# Patient Record
Sex: Male | Born: 1950 | Race: White | Hispanic: No | Marital: Married | Smoking: Never smoker
Health system: Southern US, Community
[De-identification: ages and names within clinical notes are randomized; demographics above are authoritative.]

## PROBLEM LIST (undated history)

## (undated) DIAGNOSIS — Z789 Other specified health status: Secondary | ICD-10-CM

## (undated) HISTORY — PX: VASECTOMY: SHX75

## (undated) HISTORY — PX: INGUINAL HERNIA REPAIR: SUR1180

## (undated) HISTORY — DX: Other specified health status: Z78.9

## (undated) HISTORY — PX: COLONOSCOPY: SHX174

---

## 2004-02-03 HISTORY — PX: COLONOSCOPY: SHX174

## 2004-07-22 ENCOUNTER — Ambulatory Visit: Payer: Self-pay | Admitting: Internal Medicine

## 2004-07-25 ENCOUNTER — Ambulatory Visit: Payer: Self-pay | Admitting: Internal Medicine

## 2004-09-03 ENCOUNTER — Ambulatory Visit: Payer: Self-pay | Admitting: Internal Medicine

## 2004-09-17 ENCOUNTER — Ambulatory Visit: Payer: Self-pay | Admitting: Internal Medicine

## 2004-09-17 ENCOUNTER — Encounter: Payer: Self-pay | Admitting: Family Medicine

## 2004-09-17 LAB — HM COLONOSCOPY: HM Colonoscopy: NORMAL

## 2004-10-02 ENCOUNTER — Ambulatory Visit: Payer: Self-pay | Admitting: Internal Medicine

## 2006-05-18 ENCOUNTER — Ambulatory Visit: Payer: Self-pay | Admitting: Internal Medicine

## 2010-03-04 NOTE — Procedures (Signed)
Summary: Colonoscopy Report/[facility]  Colonoscopy Report/[facility]   Imported By: Maryln Gottron 06/27/2009 10:34:49  _____________________________________________________________________  External Attachment:    Type:   Image     Comment:   External Document

## 2010-03-18 ENCOUNTER — Encounter: Payer: Self-pay | Admitting: Internal Medicine

## 2010-03-18 ENCOUNTER — Encounter (INDEPENDENT_AMBULATORY_CARE_PROVIDER_SITE_OTHER): Payer: BC Managed Care – PPO | Admitting: Internal Medicine

## 2010-03-18 DIAGNOSIS — Z0000 Encounter for general adult medical examination without abnormal findings: Secondary | ICD-10-CM

## 2010-03-18 DIAGNOSIS — N400 Enlarged prostate without lower urinary tract symptoms: Secondary | ICD-10-CM | POA: Insufficient documentation

## 2010-03-18 DIAGNOSIS — N401 Enlarged prostate with lower urinary tract symptoms: Secondary | ICD-10-CM | POA: Insufficient documentation

## 2010-03-19 ENCOUNTER — Other Ambulatory Visit: Payer: Self-pay | Admitting: Internal Medicine

## 2010-03-19 ENCOUNTER — Encounter (INDEPENDENT_AMBULATORY_CARE_PROVIDER_SITE_OTHER): Payer: Self-pay | Admitting: *Deleted

## 2010-03-19 ENCOUNTER — Other Ambulatory Visit (INDEPENDENT_AMBULATORY_CARE_PROVIDER_SITE_OTHER): Payer: BC Managed Care – PPO

## 2010-03-19 DIAGNOSIS — Z0000 Encounter for general adult medical examination without abnormal findings: Secondary | ICD-10-CM

## 2010-03-19 DIAGNOSIS — N400 Enlarged prostate without lower urinary tract symptoms: Secondary | ICD-10-CM

## 2010-03-19 LAB — BASIC METABOLIC PANEL
CO2: 30 mEq/L (ref 19–32)
Calcium: 9.4 mg/dL (ref 8.4–10.5)
Creatinine, Ser: 1.1 mg/dL (ref 0.4–1.5)

## 2010-03-19 LAB — LIPID PANEL
HDL: 45 mg/dL (ref >39.00)
Total CHOL/HDL Ratio: 3
Triglycerides: 36 mg/dL (ref 0.0–149.0)

## 2010-03-19 LAB — HEPATIC FUNCTION PANEL
Bilirubin, Direct: 0.2 mg/dL (ref 0.0–0.3)
Total Bilirubin: 1.6 mg/dL — ABNORMAL HIGH (ref 0.3–1.2)
Total Protein: 6.5 g/dL (ref 6.0–8.3)

## 2010-03-19 LAB — TSH: TSH: 1.49 u[IU]/mL (ref 0.35–5.50)

## 2010-03-20 LAB — CBC WITH DIFFERENTIAL/PLATELET
Basophils Absolute: 0 10*3/uL (ref 0.0–0.1)
Eosinophils Absolute: 0.1 10*3/uL (ref 0.0–0.7)
HCT: 43 % (ref 39.0–52.0)
Lymphs Abs: 1.5 10*3/uL (ref 0.7–4.0)
MCHC: 35.5 g/dL (ref 30.0–36.0)
Monocytes Absolute: 0.4 10*3/uL (ref 0.1–1.0)
Monocytes Relative: 8.6 % (ref 3.0–12.0)
Platelets: 170 10*3/uL (ref 150.0–400.0)
RDW: 14.2 % (ref 11.5–14.6)

## 2010-03-26 NOTE — Assessment & Plan Note (Signed)
Summary: cpx/kn   Vital Signs:  Patient profile:   60 year old male Height:      74 inches Weight:      217.6 pounds BMI:     28.04 Temp:     98.5 degrees F oral Pulse rate:   80 / minute Resp:     14 per minute BP sitting:   124 / 86  (left arm) Cuff size:   large  Vitals Entered By: Shonna Chock CMA (March 18, 2010 2:16 PM)  CC: Re-Establish: CPX , General Medical Evaluation Comments TD-UTD With in the last five years (? 2007)   CC:  Re-Establish: CPX  and General Medical Evaluation.  History of Present Illness:    James [name] is here for a physical; he is asymptomatic.  Preventive Screening-Counseling & Management  Alcohol-Tobacco     Smoking Status: never  Caffeine-Diet-Exercise     Does Patient Exercise: yes  Current Medications (verified): 1)  None  Allergies (verified): No Known Drug Allergies  Past History:  Past Medical History: Blood Donor ; Gilbert's Syndrome ; HDL 34.5 2006  Past Surgical History: Colonoscopy 2006 negative , Dr Leone Payor Vasectomy  Family History: Father: DECEASED, ? Heart Atttack @ 36 Mother: LIVING, CVA @ 75 Siblings: 3 BROTHERS, LIVING   Social History: Retired Married Never Smoked Alcohol use-yes: 3 beers / week Regular exercise-yes:Gym ; cardio & machines 90-120 min 3X/week Smoking Status:  never Does Patient Exercise:  yes  Review of Systems  The patient denies anorexia, fever, weight loss, weight gain, vision loss, decreased hearing, hoarseness, chest pain, syncope, dyspnea on exertion, peripheral edema, prolonged cough, headaches, hemoptysis, abdominal pain, melena, hematochezia, severe indigestion/heartburn, hematuria, suspicious skin lesions, depression, unusual weight change, abnormal bleeding, enlarged lymph nodes, and angioedema.    Physical Exam  General:  Well-developed,well-nourished; alert,appropriate and cooperative throughout examination Head:  Normocephalic and atraumatic without obvious  abnormalities. No apparent alopecia; moustache Eyes:  No corneal or conjunctival inflammation noted. Perrla. Funduscopic exam benign, without hemorrhages, exudates or papilledema.  Ears:  External ear exam shows no significant lesions or deformities.  Otoscopic examination reveals clear canals, tympanic membranes are intact bilaterally without bulging, retraction, inflammation or discharge. Hearing is grossly normal bilaterally. Nose:  External nasal examination shows benign papule w/o  deformity or inflammation. Nasal mucosa are pink and moist without lesions or exudates.  Mouth:  Oral mucosa and oropharynx without lesions or exudates.  Teeth in good repair. Neck:  No deformities, masses, or tenderness noted. Lungs:  Normal respiratory effort, chest expands symmetrically. Lungs are clear to auscultation, no crackles or wheezes. Heart:  Normal rate and regular rhythm. S1 and S2 normal without gallop, murmur, click, rub or other extra sounds. Abdomen:  Bowel sounds positive,abdomen soft and non-tender without masses, organomegaly or hernias noted. Rectal:  No external abnormalities noted. Normal sphincter tone. No rectal masses or tenderness. Genitalia:  Testes bilaterally descended without nodularity, tenderness or masses. No scrotal masses or lesions. No penis lesions or urethral discharge. L varicocele.  Minor Vasectomy  scar tissue Prostate:  no nodules, no asymmetry, no induration,  1+ enlarged.   Msk:  No deformity or scoliosis noted of thoracic or lumbar spine.   Pulses:  R and L carotid,radial,dorsalis pedis and posterior tibial pulses are full and equal bilaterally Extremities:  No clubbing, cyanosis, edema, or deformity noted with normal full range of motion of all joints.   Neurologic:  alert & oriented X3 and DTRs symmetrical and normal.   Skin:  Intact without suspicious lesions or rashes Cervical Nodes:  No lymphadenopathy noted Axillary Nodes:  No palpable lymphadenopathy Inguinal  Nodes:  No significant adenopathy Psych:  memory intact for recent and remote, normally interactive, and good eye contact.     Impression & Recommendations:  Problem # 1:  ROUTINE GENERAL MEDICAL EXAM@HEALTH  CARE FACL (ICD-V70.0)  Orders: EKG w/ Interpretation (93000)  Problem # 2:  HYPERPLASIA PROSTATE UNS W/O UR OBST & OTH LUTS (ICD-600.90)  Patient Instructions: 1)  Complete stool cards;schedule fasting  labs; see Diagnoses for Codes: 2)  BMP ; 3)  Hepatic Panel ; 4)  Lipid Panel ; 5)  TSH ; 6)  CBC w/ Diff ; 7)  PSA.   Orders Added: 1)  Est. Patient 40-64 years [99396] 2)  EKG w/ Interpretation [93000]

## 2010-04-11 ENCOUNTER — Other Ambulatory Visit (INDEPENDENT_AMBULATORY_CARE_PROVIDER_SITE_OTHER): Payer: BC Managed Care – PPO

## 2010-04-11 ENCOUNTER — Encounter: Payer: Self-pay | Admitting: Internal Medicine

## 2010-04-11 DIAGNOSIS — Z1211 Encounter for screening for malignant neoplasm of colon: Secondary | ICD-10-CM

## 2010-04-11 LAB — CONVERTED CEMR LAB: OCCULT 3: NEGATIVE

## 2010-04-14 ENCOUNTER — Encounter (INDEPENDENT_AMBULATORY_CARE_PROVIDER_SITE_OTHER): Payer: Self-pay | Admitting: *Deleted

## 2010-04-22 NOTE — Letter (Signed)
Summary: Results Follow up Letter  James [name]  [address] [place], [place] [address]   Phone: [phone]  Fax: [phone]    04/14/2010 MRN: [id number]  James [name] [address] [place], [place]  [address]  [place]  Dear James [name],  The following are the results of your recent test(s):  Test         Result    Pap Smear:        Normal _____  Not Normal _____ Comments: ______________________________________________________ Cholesterol: LDL(Bad cholesterol):         Your goal is less than:         HDL (Good cholesterol):       Your goal is more than: Comments:  ______________________________________________________ Mammogram:        Normal _____  Not Normal _____ Comments:  ___________________________________________________________________ Hemoccult:        Normal _X____  Not normal _______ Comments:    _____________________________________________________________________ Other Tests:    We routinely do not discuss normal results over the telephone.  If you desire a copy of the results, or you have any questions about this information we can discuss them at your next office visit.   Sincerely,

## 2010-05-21 ENCOUNTER — Telehealth: Payer: Self-pay | Admitting: *Deleted

## 2010-05-21 NOTE — Telephone Encounter (Signed)
Left msg on voicemail.

## 2010-05-23 NOTE — Telephone Encounter (Signed)
Spoke w/ pt wife appt scheduled to see Hop.

## 2010-05-27 ENCOUNTER — Encounter: Payer: Self-pay | Admitting: Internal Medicine

## 2010-05-27 ENCOUNTER — Ambulatory Visit (INDEPENDENT_AMBULATORY_CARE_PROVIDER_SITE_OTHER): Payer: BC Managed Care – PPO | Admitting: Internal Medicine

## 2010-05-27 VITALS — BP 154/90 | HR 72 | Temp 98.3°F | Wt 209.4 lb

## 2010-05-27 DIAGNOSIS — K621 Rectal polyp: Secondary | ICD-10-CM

## 2010-05-27 DIAGNOSIS — R030 Elevated blood-pressure reading, without diagnosis of hypertension: Secondary | ICD-10-CM

## 2010-05-27 DIAGNOSIS — K620 Anal polyp: Secondary | ICD-10-CM

## 2010-05-27 MED ORDER — HYDROCORTISONE ACE-PRAMOXINE 1-1 % RE FOAM: 1.0000 | Freq: Two times a day (BID) | RECTAL | Status: AC

## 2010-05-27 NOTE — Progress Notes (Signed)
  Subjective:    Patient ID: James [name], male    DOB: [birth year], 60 y.o.   MRN: [id number]  HPI RECTAL DISCOMFORT/ PAIN (Note: NO bleeding!)  Onset: 2-3 weeks ago  Radiation: no  Severity: up to 3 Quality: burning Duration: present for 2-3 weeks but improved past 3-4 days No better with Prep H   Associated Symptoms: Nausea/Vomiting: no  Diarrhea: no  Constipation: no  Melena/BRBPR: no  Anorexia: no  Fever/Chills: no  Rash: no, but a "white growth protruding out side 2 weeks ago, resolved as of 3 days" . No rectal itching ,especially nocturnally.  Wt loss: no   Past Surgeries: internal rectal abscess 1997; colonoscopy ; negative @age  52                                        No FH GI disease 15 month grandchild ; outside dog      Review of Systems see BP ; BP 134/88 @ home      Objective:   Physical Exam General appearance is one of good health and nourishment. Bowel sounds are normal. Abdomen is soft and nontender with no organomegaly, hernias  or masses.  Rectal tone is normal.External fleshly, non tender  appendage , 1 X 2 cm. The prostate is normal in size; there is no induration or nodule present. Lymphatic: No lymphadenopathy is noted about the head, neck, or axilla  areas.         Assessment & Plan:  #1 pararectal mass, rectal polyp suggested clinically rather than hemorrhoidal tissue.  #2 hypertension in the context of anxiety about present condition. By history blood pressure is well controlled at home.  Plan: ProctoFoam-HC will be applied to 3 times a day after Sitz bath.  #2 Gen. surgery referral will be made  #3 close monitoring of blood pressure will be conducted at home; goal= average less than 135/85.

## 2010-05-27 NOTE — Patient Instructions (Addendum)
Apply the ProctoFoam  HC  twice a day after using sitz baths.Rectal hygiene as discussed.                         Your BP goal = average < 135/85

## 2012-03-24 ENCOUNTER — Telehealth: Payer: Self-pay | Admitting: Internal Medicine

## 2012-03-24 NOTE — Telephone Encounter (Signed)
Patient Information:  Caller Name: Stanton Kidney  Phone: [phone]  Patient: James [name], James [name]  Gender: Male  DOB: 03-13-1950  Age: 62 Years  PCP: Marga Melnick  Office Follow Up:  Does the office need to follow up with this patient?: No  Instructions For The Office: N/A  RN Note:  Spasms of dry cough, worse during day.  Post nasal drainage present. Will try hydration and humidification. May use Mucinex as directed or Robitussin DM sparingly for hard coughing. Offered and declined appointment.  Symptoms  Reason For Call & Symptoms: Dry, deep cough with sinus drainage.  Only coughs when sitting up  Reviewed Health History In EMR: Yes  Reviewed Medications In EMR: Yes  Reviewed Allergies In EMR: Yes  Reviewed Surgeries / Procedures: Yes  Date of Onset of Symptoms: 03/21/2012  Treatments Tried: warm compress to forehead,  Robitussin Cold/cough  Treatments Tried Worked: No  Guideline(s) Used:  Cough  Disposition Per Guideline:   See Within 3 Days in Office  Reason For Disposition Reached:   Allergy symptoms are also present (e.g., itchy eyes, clear nasal discharge, postnasal drip)  Advice Given:  Reassurance  Coughing is the way that our lungs remove irritants and mucus. It helps protect our lungs from getting pneumonia.  You can get a dry hacking cough after a chest cold. Sometimes this type of cough can last 1-3 weeks, and be worse at night.  You can also get a cough after being exposed to irritating substances like smoke, strong perfumes, and dust.  Cough Medicines:  OTC Cough Syrups: The most common cough suppressant in OTC cough medications is dextromethorphan. Often the letters "DM" appear in the name.  OTC Cough Drops: Cough drops can help a lot, especially for mild coughs. They reduce coughing by soothing your irritated throat and removing that tickle sensation in the back of the throat. Cough drops also have the advantage of portability - you can carry them with you.  Home  Remedy - Hard Candy: Hard candy works just as well as medicine-flavored OTC cough drops. Diabetics should use sugar-free candy.  Home Remedy - Honey: This old home remedy has been shown to help decrease coughing at night. The adult dosage is 2 teaspoons (10 ml) at bedtime. Honey should not be given to infants under one year of age.  Caution - Dextromethorphan:   Do not try to completely suppress coughs that produce mucus and phlegm. Remember that coughing is helpful in bringing up mucus from the lungs and preventing pneumonia.  Coughing Spasms:  Drink warm fluids. Inhale warm mist (Reason: both relax the airway and loosen up the phlegm).  Prevent Dehydration:  Drink adequate liquids.  This will help soothe an irritated or dry throat and loosen up the phlegm.  Expected Course:   The expected course depends on what is causing the cough.  Viral bronchitis (chest cold) causes a cough that lasts 1 to 3 weeks. Sometimes you may cough up lots of phlegm (sputum, mucus). The mucus can normally be white, gray, yellow, or green.  Call Back If:  Difficulty breathing  Cough lasts more than 3 weeks  Fever lasts > 3 days  You become worse.  Reassurance  Colds are very common and may make you feel uncomfortable.  Colds are caused by viruses, and no medicine or "shot" will cure an uncomplicated cold.  Colds are usually not serious.  RN Overrode Recommendation:  Follow Up With Office Later  declined appointment now

## 2012-07-07 ENCOUNTER — Telehealth: Payer: Self-pay | Admitting: Internal Medicine

## 2012-07-07 ENCOUNTER — Ambulatory Visit (INDEPENDENT_AMBULATORY_CARE_PROVIDER_SITE_OTHER): Payer: BC Managed Care – PPO | Admitting: Internal Medicine

## 2012-07-07 ENCOUNTER — Encounter: Payer: Self-pay | Admitting: Internal Medicine

## 2012-07-07 VITALS — BP 128/80 | HR 85 | Temp 98.6°F | Wt 208.0 lb

## 2012-07-07 DIAGNOSIS — J209 Acute bronchitis, unspecified: Secondary | ICD-10-CM

## 2012-07-07 MED ORDER — AZITHROMYCIN 250 MG PO TABS: ORAL_TABLET | ORAL | Status: DC

## 2012-07-07 MED ORDER — HYDROCODONE-HOMATROPINE 5-1.5 MG/5ML PO SYRP: 5.0000 mL | ORAL_SOLUTION | Freq: Four times a day (QID) | ORAL | Status: DC | PRN

## 2012-07-07 NOTE — Telephone Encounter (Signed)
Patient Information:  Caller Name: Brett Canales  Phone: [phone]  Patient: James [name], James [name]  Gender: Male  DOB: 21-Jul-1950  Age: 62 Years  PCP: Marga Melnick  Office Follow Up:  Does the office need to follow up with this patient?: No  Instructions For The Office: N/A  RN Note:  Pt   Symptoms  Reason For Call & Symptoms: Cough for 3 wks.  Afebrile.  Mostly a dry cough. Intermittent coughing during the day.   In the evening pt develops hoarseness and when he goes to bed he will have an approx  30 minute coughing spell.  No other symptoms present.     Reviewed Health History In EMR: Yes  Reviewed Medications In EMR: Yes, ASA 81 mg, Fish Oil  Reviewed Allergies In EMR: Yes  Reviewed Surgeries / Procedures: Yes  Date of Onset of Symptoms: 06/13/2012  Treatments Tried: Robutussin CF and DM Honey Throat Spray Cough Drops  Treatments Tried Worked: No  Guideline(s) Used:  Cough  Disposition Per Guideline:   See Within 3 Days in Office  Reason For Disposition Reached:   Cough has been present for > 10 days  Advice Given:  Call Back If:  Difficulty breathing  You become worse.  Patient Will Follow Care Advice:  YES

## 2012-07-07 NOTE — Progress Notes (Signed)
  Subjective:    Patient ID: James [name], male    DOB: Feb 14, 1950, 62 y.o.   MRN: [id number]  HPI   Symptoms began 3.5 weeks ago as hoarseness while traveling in a  camper trailer after arriving in [place]. He subsequently developed a cough which has been essentially nonproductive 80% of the time. Occasionally will have some yellow-green sputum, mainly in the morning.  The cough has not responded to Robitussin-DM or Robitussin CF. He has noted that air-conditioning or cold fluids induce the cough  He has never smoked and has no history of asthma. He does have some history of seasonal allergies    Review of Systems His symptoms did not begin with extrinsic signs or symptoms such as itchy, watery eyes or sneezing. The cough has not been associated with wheezing or shortness of breath. He also denies fever, chills, or sweats.  He denies frontal headache, facial pain, significant nasal purulence, significant sore throat, dental pain, otic pain, or otic discharge    Objective:   Physical Exam  He appears healthy and well-nourished  He has no lymphadenopathy about the neck or axilla  ENT exam is unremarkable. Specifically there is no nasal purulence or oropharyngeal exudates.  Chest is clear to auscultation. A detailed manual and auscultory exam of the thorax was normal  He has an S4 with no significant murmurs or gallops.  There is no edema, cyanosis, or clubbing.  He's a well tanned without significant skin lesions.        Assessment & Plan:  #1 acute bronchitis without bronchospasm or associated rhinosinusitis  Plan: See orders and recommendations

## 2012-07-07 NOTE — Patient Instructions (Addendum)
Stay well hydrated. Drink to thirst up to 40 ounces of fluids daily. Advair one inhalation every 12 hours; gargle and spit after use

## 2012-07-07 NOTE — Telephone Encounter (Signed)
Pt scheduled today @ 2pm w/ Dr. Alwyn Ren.

## 2013-02-10 ENCOUNTER — Encounter: Payer: Self-pay | Admitting: Internal Medicine

## 2013-02-10 ENCOUNTER — Ambulatory Visit (INDEPENDENT_AMBULATORY_CARE_PROVIDER_SITE_OTHER): Payer: BC Managed Care – PPO | Admitting: Internal Medicine

## 2013-02-10 VITALS — BP 130/76 | HR 82 | Temp 99.1°F | Wt 213.6 lb

## 2013-02-10 DIAGNOSIS — J209 Acute bronchitis, unspecified: Secondary | ICD-10-CM

## 2013-02-10 DIAGNOSIS — J310 Chronic rhinitis: Secondary | ICD-10-CM

## 2013-02-10 MED ORDER — AZITHROMYCIN 250 MG PO TABS: ORAL_TABLET | ORAL | Status: DC

## 2013-02-10 MED ORDER — HYDROCODONE-HOMATROPINE 5-1.5 MG/5ML PO SYRP: 5.0000 mL | ORAL_SOLUTION | Freq: Four times a day (QID) | ORAL | Status: DC | PRN

## 2013-02-10 NOTE — Progress Notes (Signed)
Pre visit review using our clinic review tool, if applicable. No additional management support is needed unless otherwise documented below in the visit note. 

## 2013-02-10 NOTE — Progress Notes (Signed)
   Subjective:    Patient ID: James [name], male    DOB: January 18, 1951, 63 y.o.   MRN: [id number]  HPI   Symptoms began one week ago as a nonproductive cough. As of 1/8 he's developed head congestion. He's had some chills without a definite fever or sweats   He has had pain in the frontal and maxillary associated with postnasal drainage but not with nasal purulence.  He did not take the flu shot this year.  Review of Systems He has not had any significant myalgias or arthralgias. No otic pain or discharge  He specifically denies any extrinsic symptoms of itchy, watery eyes, sneezing.  The cough is not associated with shortness of breath or wheezing.       Objective:   Physical Exam General appearance:good health ;well nourished; no acute distress or increased work of breathing is present.  No  lymphadenopathy about the head, neck, or axilla noted.   Eyes: No conjunctival inflammation or lid edema is present. There is no scleral icterus.  Ears:  External ear exam shows no significant lesions or deformities.  Otoscopic examination reveals clear canals, tympanic membranes are intact bilaterally without bulging, retraction, inflammation or discharge.  Nose:  External nasal examination shows no deformity or inflammation. Nasal mucosa are pink and moist without lesions or exudates. No septal dislocation or deviation.No obstruction to airflow.   Oral exam: Dental hygiene is good; lips and gums are healthy appearing.There is no oropharyngeal erythema or exudate noted.   Neck:  No deformities, thyromegaly, masses, or tenderness noted.   Supple with full range of motion without pain.   Heart:  Normal rate and regular rhythm. S1 and S2 normal without gallop, murmur, click, rub or other extra sounds.   Lungs:Chest clear to auscultation; no wheezes, rhonchi,rales ,or rubs present.No increased work of breathing.    Extremities:  No cyanosis, edema, or clubbing  noted    Skin: Warm & dry w/o  jaundice or tenting.         Assessment & Plan:  #1 acute bronchitis See orders

## 2013-02-10 NOTE — Patient Instructions (Signed)

## 2014-01-08 ENCOUNTER — Encounter: Payer: BC Managed Care – PPO | Admitting: Internal Medicine

## 2014-01-12 ENCOUNTER — Other Ambulatory Visit (INDEPENDENT_AMBULATORY_CARE_PROVIDER_SITE_OTHER): Payer: BC Managed Care – PPO

## 2014-01-12 ENCOUNTER — Encounter: Payer: Self-pay | Admitting: Internal Medicine

## 2014-01-12 ENCOUNTER — Ambulatory Visit (INDEPENDENT_AMBULATORY_CARE_PROVIDER_SITE_OTHER): Payer: BC Managed Care – PPO

## 2014-01-12 ENCOUNTER — Ambulatory Visit (INDEPENDENT_AMBULATORY_CARE_PROVIDER_SITE_OTHER): Payer: BC Managed Care – PPO | Admitting: Internal Medicine

## 2014-01-12 VITALS — BP 130/84 | HR 67 | Temp 98.0°F | Resp 13 | Ht 74.0 in | Wt 211.0 lb

## 2014-01-12 DIAGNOSIS — Z23 Encounter for immunization: Secondary | ICD-10-CM

## 2014-01-12 DIAGNOSIS — N400 Enlarged prostate without lower urinary tract symptoms: Secondary | ICD-10-CM

## 2014-01-12 DIAGNOSIS — Z0189 Encounter for other specified special examinations: Secondary | ICD-10-CM

## 2014-01-12 DIAGNOSIS — Z0000 Encounter for general adult medical examination without abnormal findings: Secondary | ICD-10-CM

## 2014-01-12 DIAGNOSIS — J3089 Other allergic rhinitis: Secondary | ICD-10-CM

## 2014-01-12 DIAGNOSIS — L719 Rosacea, unspecified: Secondary | ICD-10-CM

## 2014-01-12 DIAGNOSIS — J309 Allergic rhinitis, unspecified: Secondary | ICD-10-CM

## 2014-01-12 DIAGNOSIS — Z8249 Family history of ischemic heart disease and other diseases of the circulatory system: Secondary | ICD-10-CM

## 2014-01-12 LAB — CBC WITH DIFFERENTIAL/PLATELET
Basophils Absolute: 0 10*3/uL (ref 0.0–0.1)
Basophils Relative: 0.6 % (ref 0.0–3.0)
EOS PCT: 3.9 % (ref 0.0–5.0)
Eosinophils Absolute: 0.2 10*3/uL (ref 0.0–0.7)
HEMATOCRIT: 45.6 % (ref 39.0–52.0)
HEMOGLOBIN: 15.8 g/dL (ref 13.0–17.0)
LYMPHS ABS: 1.4 10*3/uL (ref 0.7–4.0)
Lymphocytes Relative: 26.9 % (ref 12.0–46.0)
MCHC: 34.7 g/dL (ref 30.0–36.0)
MCV: 87.1 fl (ref 78.0–100.0)
MONO ABS: 0.4 10*3/uL (ref 0.1–1.0)
Monocytes Relative: 7.5 % (ref 3.0–12.0)
NEUTROS ABS: 3.2 10*3/uL (ref 1.4–7.7)
Neutrophils Relative %: 61.1 % (ref 43.0–77.0)
Platelets: 192 10*3/uL (ref 150.0–400.0)
RBC: 5.24 Mil/uL (ref 4.22–5.81)
RDW: 13.3 % (ref 11.5–15.5)
WBC: 5.2 10*3/uL (ref 4.0–10.5)

## 2014-01-12 LAB — LIPID PANEL
Cholesterol: 145 mg/dL (ref 0–200)
HDL: 50.6 mg/dL (ref >39.00)
LDL Cholesterol: 87 mg/dL (ref 0–99)
NONHDL: 94.4
Total CHOL/HDL Ratio: 3
Triglycerides: 37 mg/dL (ref 0.0–149.0)
VLDL: 7.4 mg/dL (ref 0.0–40.0)

## 2014-01-12 LAB — HEPATIC FUNCTION PANEL
ALBUMIN: 4.4 g/dL (ref 3.5–5.2)
ALT: 21 U/L (ref 0–53)
AST: 24 U/L (ref 0–37)
Alkaline Phosphatase: 50 U/L (ref 39–117)
Bilirubin, Direct: 0.3 mg/dL (ref 0.0–0.3)
Total Bilirubin: 1.7 mg/dL — ABNORMAL HIGH (ref 0.2–1.2)
Total Protein: 7.1 g/dL (ref 6.0–8.3)

## 2014-01-12 LAB — BASIC METABOLIC PANEL
BUN: 15 mg/dL (ref 6–23)
CHLORIDE: 105 meq/L (ref 96–112)
CO2: 26 mEq/L (ref 19–32)
Calcium: 9.5 mg/dL (ref 8.4–10.5)
Creatinine, Ser: 1 mg/dL (ref 0.4–1.5)
GFR: 83.63 mL/min (ref >60.00)
Glucose, Bld: 93 mg/dL (ref 70–99)
POTASSIUM: 4.5 meq/L (ref 3.5–5.1)
SODIUM: 136 meq/L (ref 135–145)

## 2014-01-12 LAB — TSH: TSH: 1.52 u[IU]/mL (ref 0.35–4.50)

## 2014-01-12 MED ORDER — METRONIDAZOLE 0.75 % EX GEL: 1.0000 "application " | Freq: Two times a day (BID) | CUTANEOUS | Status: DC

## 2014-01-12 NOTE — Progress Notes (Signed)
   Subjective:    Patient ID: James [name], male    DOB: 10/23/1950, 63 y.o.   MRN: [id number]  HPI  He is here for a physical;acute issues denied.  He is on a modified heart healthy diet. He exercises 90 minutes 3 times a week including 30 minutes of cardiovascular exercise. He denies any associated cardiopulmonary symptoms  His father had a heart attack at 63. His father did smoke cigars.  His lipid panel has been excellent in the past.     Review of Systems   Chest pain, palpitations, tachycardia, exertional dyspnea, paroxysmal nocturnal dyspnea, claudication or edema are absent.  He has intermittent nonproductive cough which appears to be related to postnasal drainage. He also has associated sneezing. He denies exercise-induced bronchospasm symptoms.       Objective:   Physical Exam  Gen.: Healthy and well-nourished in appearance. Alert, appropriate and cooperative throughout exam. Appears younger than stated age  Head: Normocephalic without obvious abnormalities;  Moustache; no alopecia  Eyes: No corneal or conjunctival inflammation noted. Pupils equal round reactive to light and accommodation. Extraocular motion intact.  Ears: External  ear exam reveals no significant lesions or deformities. Canals clear .TMs normal. Hearing is grossly normal bilaterally. Nose: External nasal exam reveals no deformity or inflammation. Nasal mucosa are pink and moist. No lesions or exudates noted.   Mouth: Oral mucosa and oropharynx reveal no lesions or exudates. Teeth in good repair. Neck: No deformities, masses, or tenderness noted. Range of motion & Thyroid normal. Lungs: Normal respiratory effort; chest expands symmetrically. Lungs are clear to auscultation without rales, wheezes, or increased work of breathing. Heart: Normal rate and rhythm. Normal S1 and S2. No gallop, click, or rub. No murmur. Abdomen: Bowel sounds normal; abdomen soft and nontender. No masses, organomegaly or  hernias noted. Genitalia:  normal. Prostate is normal without enlargement, asymmetry, nodularity, or induration                                    Musculoskeletal/extremities: No deformity or scoliosis noted of  the thoracic or lumbar spine No clubbing, cyanosis, edema, or significant extremity  deformity noted.  Range of motion normal . Tone & strength normal. Hand joints normal. Fingernail health good. Fungal toenail changes Able to lie down & sit up w/o help.  Negative SLR bilaterally Vascular: Carotid, radial artery, dorsalis pedis and  posterior tibial pulses are full and equal. No bruits present. Neurologic: Alert and oriented x3. Deep tendon reflexes symmetrical and normal.  Gait normal      Skin: Intact without suspicious lesions or rashes.Benign papule tip of nose Lymph: No cervical, axillary, or inguinal lymphadenopathy present. Psych: Mood and affect are normal. Normally interactive                                                                                     Assessment & Plan:  #1 comprehensive physical exam; no acute findings  Plan: see Orders  & Recommendations

## 2014-01-12 NOTE — Progress Notes (Signed)
Pre visit review using our clinic review tool, if applicable. No additional management support is needed unless otherwise documented below in the visit note. 

## 2014-01-12 NOTE — Patient Instructions (Addendum)
Your next office appointment will be determined based upon review of your pending labs . Those instructions will be transmitted to you through My Chart   After showering  use a hairdryer to blow the nails dry. Also do this after any activities which cause sweating. Change socks repeatedly through the day if the feet do sweat. Tennis shoes or other foot wear which appear somewhat moldy should be discarded. Plain Mucinex (NOT D) for thick secretions ;force NON dairy fluids .   Nasal cleansing in the shower as discussed with lather of mild shampoo.After 10 seconds wash off lather while  exhaling through nostrils. Make sure that all residual soap is removed to prevent irritation.  Flonase OR Nasacort AQ 1 spray in each nostril twice a day as needed. Use the "crossover" technique into opposite nostril spraying toward opposite ear @ 45 degree angle, not straight up into nostril.  Use a Neti pot daily only  as needed for significant sinus congestion; going from open side to congested side . Plain Allegra (NOT D )  160 daily , Loratidine 10 mg , OR Zyrtec 10 mg @ bedtime  as needed for itchy eyes & sneezing. As per the Standard of Care , screening Colonoscopy recommended @ 50 & every 10 years thereafter . YOU ARE DUE IN 2016 .

## 2014-05-04 ENCOUNTER — Encounter: Payer: Self-pay | Admitting: Internal Medicine

## 2014-11-05 ENCOUNTER — Encounter: Payer: Self-pay | Admitting: Internal Medicine

## 2015-03-25 ENCOUNTER — Encounter: Payer: Self-pay | Admitting: Internal Medicine

## 2015-05-13 ENCOUNTER — Telehealth: Payer: Self-pay | Admitting: *Deleted

## 2015-05-13 NOTE — Telephone Encounter (Signed)
Fine with me

## 2015-05-13 NOTE — Telephone Encounter (Signed)
This patient  had a colon with Dr Carlean Purl 09-17-2004 that was normal  and has a colon scheduled for 05-29-15  with Dr Ardis Hughs.  I called this am to Schedule with Dr Carlean Purl and the wife states she wishes the pt. to see Dr Ardis Hughs. She gave no reason as to this change. Is this change ok with both of you?  Please advise  Thanks, Marijean Niemann

## 2015-05-13 NOTE — Telephone Encounter (Signed)
Dr Jacobs Please advise 

## 2015-05-14 NOTE — Telephone Encounter (Signed)
Ok with me as well  

## 2015-05-14 NOTE — Telephone Encounter (Signed)
Will proceed as scheduled. Thanks to all [facility]

## 2015-05-16 ENCOUNTER — Ambulatory Visit (AMBULATORY_SURGERY_CENTER): Payer: Self-pay | Admitting: *Deleted

## 2015-05-16 VITALS — Ht 74.0 in | Wt 205.0 lb

## 2015-05-16 DIAGNOSIS — Z1211 Encounter for screening for malignant neoplasm of colon: Secondary | ICD-10-CM

## 2015-05-16 MED ORDER — NA SULFATE-K SULFATE-MG SULF 17.5-3.13-1.6 GM/177ML PO SOLN: 1.0000 | Freq: Once | ORAL | Status: DC

## 2015-05-16 NOTE — Progress Notes (Signed)
No egg or soy allergy known to patient  No issues with past sedation with any surgeries  or procedures, no intubation problems  No diet pills per patient No home 02 use per patient  No blood thinners per patient  Pt denies issues with constipation  emmi declined

## 2015-05-29 ENCOUNTER — Encounter: Payer: Self-pay | Admitting: Gastroenterology

## 2015-05-29 ENCOUNTER — Ambulatory Visit (AMBULATORY_SURGERY_CENTER): Payer: BC Managed Care – PPO | Admitting: Gastroenterology

## 2015-05-29 VITALS — BP 113/79 | HR 68 | Temp 98.6°F | Resp 13 | Ht 74.0 in | Wt 205.0 lb

## 2015-05-29 DIAGNOSIS — Z1211 Encounter for screening for malignant neoplasm of colon: Secondary | ICD-10-CM | POA: Diagnosis not present

## 2015-05-29 DIAGNOSIS — K635 Polyp of colon: Secondary | ICD-10-CM

## 2015-05-29 DIAGNOSIS — D124 Benign neoplasm of descending colon: Secondary | ICD-10-CM

## 2015-05-29 DIAGNOSIS — D125 Benign neoplasm of sigmoid colon: Secondary | ICD-10-CM | POA: Diagnosis not present

## 2015-05-29 DIAGNOSIS — D126 Benign neoplasm of colon, unspecified: Secondary | ICD-10-CM | POA: Diagnosis not present

## 2015-05-29 MED ORDER — SODIUM CHLORIDE 0.9 % IV SOLN: 500.0000 mL | INTRAVENOUS | Status: DC

## 2015-05-29 NOTE — Progress Notes (Signed)
Called to room to assist during endoscopic procedure.  Patient ID and intended procedure confirmed with present staff. Received instructions for my participation in the procedure from the performing physician.  

## 2015-05-29 NOTE — Progress Notes (Signed)
To pacu vss patent aw report to rn 

## 2015-05-29 NOTE — Op Note (Signed)
[place] Patient Name: James [name] Procedure Date: 05/29/2015 9:18 AM MRN: [id number] Endoscopist: Milus Banister , MD Age: 65 Date of Birth: [birth year] Gender: Male Procedure:                Colonoscopy Indications:              Screening for colorectal malignant neoplasm Medicines:                Monitored Anesthesia Care Procedure:                Pre-Anesthesia Assessment:                           - Prior to the procedure, a History and Physical                            was performed, and patient medications and                            allergies were reviewed. The patient's tolerance of                            previous anesthesia was also reviewed. The risks                            and benefits of the procedure and the sedation                            options and risks were discussed with the patient.                            All questions were answered, and informed consent                            was obtained. Prior Anticoagulants: The patient has                            taken no previous anticoagulant or antiplatelet                            agents. ASA Grade Assessment: II - A patient with                            mild systemic disease. After reviewing the risks                            and benefits, the patient was deemed in                            satisfactory condition to undergo the procedure.                           After obtaining informed consent, the colonoscope  was passed under direct vision. Throughout the                            procedure, the patient's blood pressure, pulse, and                            oxygen saturations were monitored continuously. The                            Model CF-HQ190L [id number]) scope was introduced                            through the anus and advanced to the the cecum,                            identified by appendiceal orifice and ileocecal                     valve. The colonoscopy was performed without                            difficulty. The patient tolerated the procedure                            well. The quality of the bowel preparation was                            excellent. The ileocecal valve, appendiceal                            orifice, and rectum were photographed. Scope In: 9:25:22 AM Scope Out: 9:44:53 AM Scope Withdrawal Time: 0 hours 16 minutes 3 seconds  Total Procedure Duration: 0 hours 19 minutes 31 seconds  Findings:                 Two sessile polyps were found in the sigmoid colon                            and descending colon. The polyps were 3 to 5 mm in                            size. These polyps were removed with a cold snare.                            Resection and retrieval were complete.                           The exam was otherwise without abnormality on                            direct and retroflexion views. Complications:            No immediate complications. Estimated blood loss:  None. Estimated Blood Loss:     Estimated blood loss: none. Impression:               - Two 3 to 5 mm polyps in the sigmoid colon and in                            the descending colon, removed with a cold snare.                            Resected and retrieved.                           - The examination was otherwise normal on direct                            and retroflexion views. Recommendation:           - Patient has a contact number available for                            emergencies. The signs and symptoms of potential                            delayed complications were discussed with the                            patient. Return to normal activities tomorrow.                            Written discharge instructions were provided to the                            patient.                           - Resume previous diet.                           - Continue  present medications.                           You will receive a letter within 2-3 weeks with the                            pathology results and my final recommendations.                           If the polyp(s) is proven to be 'pre-cancerous' on                            pathology, you will need repeat colonoscopy in 5                            years. If the polyp(s) is NOT 'precancerous' on  pathology then you should repeat colon cancer                            screening in 10 years with colonoscopy without need                            for colon cancer screening by any method prior to                            then (including stool testing). Milus Banister, MD 05/29/2015 9:47:24 AM This report has been signed electronically.

## 2015-05-29 NOTE — Patient Instructions (Signed)
Colon polyps x 2 removed today. Handout given on polyps. Result letter in your mail in 2-3 weeks. Resume current medications. Call us with any questions or concerns. Thank you!  YOU HAD AN ENDOSCOPIC PROCEDURE TODAY AT [facility]:   Refer to the procedure report that was given to you for any specific questions about what was found during the examination.  If the procedure report does not answer your questions, please call your gastroenterologist to clarify.  If you requested that your care partner not be given the details of your procedure findings, then the procedure report has been included in a sealed envelope for you to review at your convenience later.  YOU SHOULD EXPECT: Some feelings of bloating in the abdomen. Passage of more gas than usual.  Walking can help get rid of the air that was put into your GI tract during the procedure and reduce the bloating. If you had a lower endoscopy (such as a colonoscopy or flexible sigmoidoscopy) you may notice spotting of blood in your stool or on the toilet paper. If you underwent a bowel prep for your procedure, you may not have a normal bowel movement for a few days.  Please Note:  You might notice some irritation and congestion in your nose or some drainage.  This is from the oxygen used during your procedure.  There is no need for concern and it should clear up in a day or so.  SYMPTOMS TO REPORT IMMEDIATELY:   Following lower endoscopy (colonoscopy or flexible sigmoidoscopy):  Excessive amounts of blood in the stool  Significant tenderness or worsening of abdominal pains  Swelling of the abdomen that is new, acute  Fever of 100F or higher  For urgent or emergent issues, a gastroenterologist can be reached at any hour by calling [phone].   DIET: Your first meal following the procedure should be a small meal and then it is ok to progress to your normal diet. Heavy or fried foods are harder to digest and may make you feel  nauseous or bloated.  Likewise, meals heavy in dairy and vegetables can increase bloating.  Drink plenty of fluids but you should avoid alcoholic beverages for 24 hours.  ACTIVITY:  You should plan to take it easy for the rest of today and you should NOT DRIVE or use heavy machinery until tomorrow (because of the sedation medicines used during the test).    FOLLOW UP: Our staff will call the number listed on your records the next business day following your procedure to check on you and address any questions or concerns that you may have regarding the information given to you following your procedure. If we do not reach you, we will leave a message.  However, if you are feeling well and you are not experiencing any problems, there is no need to return our call.  We will assume that you have returned to your regular daily activities without incident.  If any biopsies were taken you will be contacted by phone or by letter within the next 1-3 weeks.  Please call us at [phone] if you have not heard about the biopsies in 3 weeks.    SIGNATURES/CONFIDENTIALITY: You and/or your care partner have signed paperwork which will be entered into your electronic medical record.  These signatures attest to the fact that that the information above on your After Visit Summary has been reviewed and is understood.  Full responsibility of the confidentiality of this discharge information lies  with you and/or your care-partner.

## 2015-05-30 ENCOUNTER — Telehealth: Payer: Self-pay

## 2015-05-30 ENCOUNTER — Encounter: Payer: BC Managed Care – PPO | Admitting: Internal Medicine

## 2015-05-30 NOTE — Telephone Encounter (Signed)
  Follow up Call-  Call back number 05/29/2015  Post procedure Call Back phone  # [phone]  Permission to leave phone message Yes     Patient questions:  Do you have a fever, pain , or abdominal swelling? No. Pain Score  0 *  Have you tolerated food without any problems? Yes.    Have you been able to return to your normal activities? Yes.    Do you have any questions about your discharge instructions: Diet   No Medications  No. Follow up visit  No.  Do you have questions or concerns about your Care? No.  Actions: * If pain score is 4 or above: No action needed, pain <4.

## 2015-06-04 ENCOUNTER — Encounter: Payer: Self-pay | Admitting: Gastroenterology

## 2015-10-07 ENCOUNTER — Encounter: Payer: Self-pay | Admitting: *Deleted

## 2015-10-07 ENCOUNTER — Emergency Department
Admission: EM | Admit: 2015-10-07 | Discharge: 2015-10-07 | Disposition: A | Discharge status: Home / Self Care | Payer: BC Managed Care – PPO | Source: Home / Self Care | Attending: Emergency Medicine | Admitting: Emergency Medicine

## 2015-10-07 DIAGNOSIS — Z23 Encounter for immunization: Secondary | ICD-10-CM

## 2015-10-07 DIAGNOSIS — S81812A Laceration without foreign body, left lower leg, initial encounter: Secondary | ICD-10-CM

## 2015-10-07 DIAGNOSIS — L03116 Cellulitis of left lower limb: Secondary | ICD-10-CM

## 2015-10-07 MED ORDER — CEFTRIAXONE SODIUM 1 G IJ SOLR
1.0000 g | INTRAMUSCULAR | Status: AC
  Administered 2015-10-07: 1 g via INTRAMUSCULAR

## 2015-10-07 MED ORDER — TETANUS-DIPHTH-ACELL PERTUSSIS 5-2.5-18.5 LF-MCG/0.5 IM SUSP
0.5000 mL | Freq: Once | INTRAMUSCULAR | Status: AC
  Administered 2015-10-07: 0.5 mL via INTRAMUSCULAR

## 2015-10-07 MED ORDER — DOXYCYCLINE HYCLATE 100 MG PO CAPS: 100.0000 mg | ORAL_CAPSULE | Freq: Two times a day (BID) | ORAL | 0 refills | Status: DC

## 2015-10-07 MED ORDER — HYDROCODONE-ACETAMINOPHEN 5-325 MG PO TABS: 1.0000 | ORAL_TABLET | ORAL | 0 refills | Status: DC | PRN

## 2015-10-07 NOTE — ED Provider Notes (Addendum)
[name]    CSN: [id number] Arrival date & time: 10/07/15  1604  First Provider Contact:  None       History   Chief Complaint Chief Complaint  Patient presents with  . Extremity Laceration    HPI James [name] is a 65 y.o. male.   HPI Presents to [facility] with wife. 8 days ago, accidentally cut left anterior ankle on a tin, it initially bled, but bleeding stopped with direct pressure, and he stunned home wound care since then. Over past 2 days, worsening swelling and pain in the wound with scant amount of yellow drainage. Pain at wound site is 4 out of 10. Dull. He and wife have noted one day of worsening redness and swelling around the wound, and fever and chills last night. No chest pain or shortness of breath or nausea or vomiting. No abdominal pain or change of bowel habits. Last tetanus shot over 5 years ago. No lightheadedness or weakness or syncope.   No known drug allergies History reviewed. No pertinent past medical history.  Patient Active Problem List   Diagnosis Date Noted  . Family history of premature coronary artery disease 01/12/2014  . Rosacea 01/12/2014  . Perennial allergic rhinitis 01/12/2014  . BPH without obstruction/lower urinary tract symptoms 03/18/2010  . [place] SYNDROME 11/23/2006    Past Surgical History:  Procedure Laterality Date  . COLONOSCOPY  2006   Neg, Dr.Gessner  . VASECTOMY         Home Medications    Prior to Admission medications   Medication Sig Start Date End Date Taking? Authorizing Provider  aspirin EC 81 MG tablet Take 81 mg by mouth daily.    Historical Provider, MD  doxycycline (VIBRAMYCIN) 100 MG capsule Take 1 capsule (100 mg total) by mouth 2 (two) times daily. For 10 days 10/07/15   Jacqulyn Cane, MD  HYDROcodone-acetaminophen (NORCO/VICODIN) 5-325 MG tablet Take 1-2 tablets by mouth every 4 (four) hours as needed for severe pain. Take with food. 10/07/15   Jacqulyn Cane, MD    Misc Natural Products (OSTEO BI-FLEX ADV JOINT SHIELD PO) Take 1 capsule by mouth daily.    Historical Provider, MD  Omega-3 Fatty Acids (FISH OIL) 1200 MG CAPS Take by mouth daily.    Historical Provider, MD    Family History Family History  Problem Relation Age of Onset  . Stroke Mother 34  . Heart attack Father 76  . Cancer Neg Hx   . Diabetes Neg Hx   . Colon cancer Neg Hx   . Colon polyps Neg Hx     Social History Social History  Substance Use Topics  . Smoking status: Never Smoker  . Smokeless tobacco: Never Used  . Alcohol use 0.0 oz/week     Comment: 3 Beer/week     Allergies   Review of patient's allergies indicates no known allergies.   Review of Systems Review of Systems  All other systems reviewed and are negative.    Physical Exam Triage Vital Signs ED Triage Vitals  Enc Vitals Group     BP 10/07/15 1624 153/84     Pulse Rate 10/07/15 1624 81     Resp 10/07/15 1624 16     Temp 10/07/15 1624 98.3 F (36.8 C)     Temp Source 10/07/15 1624 Oral     SpO2 10/07/15 1624 96 %     Weight 10/07/15 1624 205 lb (93 kg)     Height --  Head Circumference --      Peak Flow --      Pain Score 10/07/15 1625 0     Pain Loc --      Pain Edu? --      Excl. in [place]? --    No data found.   Updated Vital Signs BP 153/84 (BP Location: Left Arm)   Pulse 81   Temp 98.3 F (36.8 C) (Oral)   Resp 16   Wt 205 lb (93 kg)   SpO2 96%   BMI 26.32 kg/m   Visual Acuity Right Eye Distance:   Left Eye Distance:   Bilateral Distance:    Right Eye Near:   Left Eye Near:    Bilateral Near:     Physical Exam  Constitutional: He is oriented to person, place, and time. He appears well-developed and well-nourished. No distress.  HENT:  Head: Normocephalic and atraumatic.  Eyes: Pupils are equal, round, and reactive to light. No scleral icterus.  Neck: Normal range of motion. Neck supple.  Cardiovascular: Normal rate and regular rhythm.   Pulmonary/Chest:  Effort normal.  Abdominal: He exhibits no distension.  Musculoskeletal:       Feet:  Within the wound is small, moist yellowish area of devitalized tissue. No fluctuance.  Neurological: He is alert and oriented to person, place, and time.  Skin: Skin is warm and dry.  Psychiatric: He has a normal mood and affect. His behavior is normal.     UC Treatments / Results  Labs (all labs ordered are listed, but only abnormal results are displayed) Labs Reviewed  WOUND CULTURE   After risks, benefits, alternatives discussed with patient and wife, they consented to the following, which I performed: Sterile prep of wound. Pinpoint incision with #11 blade, bright red blood with scant amount of pus exuded. Sent for culture. Devitalized wound tissue sparingly debrided. Antibiotic ointment, nonstick dressing applied. Patient tolerated well.  EKG  EKG Interpretation None       Radiology No results found.  Procedures Procedures (including critical care time)  Medications Ordered in UC Medications  Tdap (BOOSTRIX) injection 0.5 mL (0.5 mLs Intramuscular Given 10/07/15 1659)  cefTRIAXone (ROCEPHIN) injection 1 g (1 g Intramuscular Given 10/07/15 1700)     Initial Impression / Assessment and Plan / UC Course  I have reviewed the triage vital signs and the nursing notes.  Pertinent labs & imaging results that were available during my care of the patient were reviewed by me and considered in my medical decision making (see chart for details).  Clinical Course   Pinpoint I&D and wound debrided, see above. Culture sent. Wound care discussed with patient and wife. See instructions in AVS. TDap Given. In my opinion, cellulitis left leg warrants initial tx with parenteral antibiotic. Rocephin 1 g IM stat given. Prescription for doxycycline. Follow-up here or with PCP within 2 days for wound recheck and to redress wound.  Precautions discussed. An After Visit Summary was printed and  given to the patient. Red flags discussed. Questions invited and answered. They voiced understanding and agreement.   Final Clinical Impressions(s) / UC Diagnoses   Final diagnoses:  Leg laceration, left, initial encounter  Cellulitis of left ankle    New Prescriptions Discharge Medication List as of 10/07/2015  4:57 PM    START taking these medications   Details  doxycycline (VIBRAMYCIN) 100 MG capsule Take 1 capsule (100 mg total) by mouth 2 (two) times daily. For 10 days, Starting Mon 10/07/2015, Print  HYDROcodone-acetaminophen (NORCO/VICODIN) 5-325 MG tablet Take 1-2 tablets by mouth every 4 (four) hours as needed for severe pain. Take with food., Starting Mon 10/07/2015, Print         Jacqulyn Cane, MD 10/07/15 1800    Jacqulyn Cane, MD 10/07/15 [phone]

## 2015-10-07 NOTE — ED Triage Notes (Signed)
Pt c/o infected laceration on his LT ankle x 8 days after cutting it on a piece of tin.  Tdap unknown.

## 2015-10-07 NOTE — Discharge Instructions (Signed)
Follow daily wound care as instructed. Today, we gave you a shot of antibiotic-Rocephin Also, start prescription for doxycycline twice a day for 10 days.  We also gave you tetanus shot today

## 2015-10-11 ENCOUNTER — Telehealth: Payer: Self-pay | Admitting: Emergency Medicine

## 2015-10-11 LAB — WOUND CULTURE
Gram Stain: NONE SEEN
Gram Stain: NONE SEEN
Organism ID, Bacteria: NO GROWTH

## 2015-10-11 NOTE — Telephone Encounter (Signed)
Labs neg, getting a lot better

## 2016-09-24 ENCOUNTER — Ambulatory Visit: Payer: BC Managed Care – PPO | Admitting: Medical

## 2016-09-24 ENCOUNTER — Encounter: Payer: Self-pay | Admitting: Family Medicine

## 2016-09-24 ENCOUNTER — Ambulatory Visit (INDEPENDENT_AMBULATORY_CARE_PROVIDER_SITE_OTHER): Payer: BC Managed Care – PPO | Admitting: Family Medicine

## 2016-09-24 VITALS — BP 124/80 | HR 64 | Temp 97.8°F | Ht 74.0 in | Wt 212.0 lb

## 2016-09-24 DIAGNOSIS — Z0000 Encounter for general adult medical examination without abnormal findings: Secondary | ICD-10-CM

## 2016-09-24 DIAGNOSIS — Z125 Encounter for screening for malignant neoplasm of prostate: Secondary | ICD-10-CM | POA: Diagnosis not present

## 2016-09-24 DIAGNOSIS — Z114 Encounter for screening for human immunodeficiency virus [HIV]: Secondary | ICD-10-CM

## 2016-09-24 DIAGNOSIS — Z1159 Encounter for screening for other viral diseases: Secondary | ICD-10-CM | POA: Diagnosis not present

## 2016-09-24 LAB — COMPREHENSIVE METABOLIC PANEL
ALK PHOS: 50 U/L (ref 39–117)
ALT: 16 U/L (ref 0–53)
AST: 18 U/L (ref 0–37)
Albumin: 4.6 g/dL (ref 3.5–5.2)
BILIRUBIN TOTAL: 1.1 mg/dL (ref 0.2–1.2)
BUN: 14 mg/dL (ref 6–23)
CO2: 27 meq/L (ref 19–32)
CREATININE: 1.06 mg/dL (ref 0.40–1.50)
Calcium: 9.5 mg/dL (ref 8.4–10.5)
Chloride: 106 mEq/L (ref 96–112)
GFR: 74.83 mL/min (ref >60.00)
GLUCOSE: 89 mg/dL (ref 70–99)
Potassium: 4.5 mEq/L (ref 3.5–5.1)
SODIUM: 138 meq/L (ref 135–145)
TOTAL PROTEIN: 7.2 g/dL (ref 6.0–8.3)

## 2016-09-24 LAB — CBC
HCT: 44.2 % (ref 39.0–52.0)
HEMOGLOBIN: 15.3 g/dL (ref 13.0–17.0)
MCHC: 34.7 g/dL (ref 30.0–36.0)
MCV: 85.7 fl (ref 78.0–100.0)
Platelets: 183 10*3/uL (ref 150.0–400.0)
RBC: 5.16 Mil/uL (ref 4.22–5.81)
RDW: 14.5 % (ref 11.5–15.5)
WBC: 3.9 10*3/uL — ABNORMAL LOW (ref 4.0–10.5)

## 2016-09-24 LAB — LIPID PANEL
CHOL/HDL RATIO: 3
Cholesterol: 130 mg/dL (ref 0–200)
HDL: 48.8 mg/dL (ref >39.00)
LDL Cholesterol: 71 mg/dL (ref 0–99)
NONHDL: 80.81
Triglycerides: 48 mg/dL (ref 0.0–149.0)
VLDL: 9.6 mg/dL (ref 0.0–40.0)

## 2016-09-24 LAB — PSA: PSA: 2.85 ng/mL (ref 0.10–4.00)

## 2016-09-24 LAB — HEPATITIS C ANTIBODY: HCV AB: NONREACTIVE

## 2016-09-24 NOTE — Patient Instructions (Signed)
Give us 2-3 business days to get the results of your labs back.   Keep up the good work.   Let us know if you need anything.  

## 2016-09-24 NOTE — Progress Notes (Signed)
Chief Complaint  Patient presents with  . Establish Care    Well Male James [name] is here for a complete physical.   His last physical was >1 year ago.  Current diet: in general, a "healthy" diet   Current exercise: [employer] 3x/week; 30 min cardio, weights Weight trend: stable Does pt snore? Sometimes. Daytime fatigue? No. Seat belt? Yes.    Health maintenance Colonoscopy- Yes Tetanus- Yes HIV- No Hep C- No Prostate cancer screening- No   Past Medical History:  Diagnosis Date  . No known problems     Past Surgical History:  Procedure Laterality Date  . COLONOSCOPY  2006   Neg, Dr.Gessner  . VASECTOMY     Medications  Current Outpatient Prescriptions on File Prior to Visit  Medication Sig Dispense Refill  . aspirin EC 81 MG tablet Take 81 mg by mouth daily.    . Misc Natural Products (OSTEO BI-FLEX ADV JOINT SHIELD PO) Take 1 capsule by mouth daily.     Allergies No Known Allergies Family History Family History  Problem Relation Age of Onset  . Stroke Mother 24  . Heart attack Father 23  . Cancer Neg Hx   . Diabetes Neg Hx   . Colon cancer Neg Hx   . Colon polyps Neg Hx     Review of Systems: Constitutional:  no unexpected change in weight, no fevers or chills Eye:  no recent significant change in vision Ear/Nose/Mouth/Throat:  Ears:  no tinnitus or hearing loss Nose/Mouth/Throat:  no complaints of nasal congestion or bleeding, no sore throat and oral sores Cardiovascular:  no chest pain, no palpitations Respiratory:  no cough and no shortness of breath Gastrointestinal:  no abdominal pain, no change in bowel habits, no nausea, vomiting, diarrhea, or constipation and no black or bloody stool GU:  Male: negative for dysuria, frequency, and incontinence and negative for prostate symptoms Musculoskeletal/Extremities:  no pain, redness, or swelling of the joints Integumentary (Skin/Breast):  no abnormal skin lesions reported Neurologic:  no headaches,  no numbness, tingling Endocrine: No weight changes, masses in the neck, heat/cold intolerance, bowel or skin changes, or cardiovascular system symptoms Hematologic/Lymphatic:  no abnormal bleeding, no HIV risk factors, no night sweats, no swollen nodes, no weight loss  Exam BP 124/80 (BP Location: Left Arm, Patient Position: Sitting, Cuff Size: Normal)   Pulse 64   Temp 97.8 F (36.6 C) (Oral)   Ht 6\' 2"  (1.88 m)   Wt 212 lb (96.2 kg)   SpO2 98%   BMI 27.22 kg/m  General:  well developed, well nourished, in no apparent distress Skin:  no significant moles, warts, or growths Head:  no masses, lesions, or tenderness Eyes:  pupils equal and round, sclera anicteric without injection Ears:  canals without lesions, TMs shiny without retraction, no obvious effusion, no erythema Nose:  nares patent, septum midline, mucosa normal Throat/Pharynx:  lips and gingiva without lesion; tongue and uvula midline; non-inflamed pharynx; no exudates or postnasal drainage Neck: neck supple without adenopathy, thyromegaly, or masses Lungs:  clear to auscultation, breath sounds equal bilaterally, no respiratory distress Cardio:  regular rate and rhythm without murmurs, heart sounds without clicks or rubs Abdomen:  abdomen soft, nontender; bowel sounds normal; no masses or organomegaly Genital (male): circumcised penis, no lesions or discharge; testes present bilaterally without masses or tenderness Rectal: Deferred Musculoskeletal:  symmetrical muscle groups noted without atrophy or deformity Extremities:  no clubbing, cyanosis, or edema, no deformities, no skin discoloration Neuro:  gait normal; deep tendon reflexes normal and symmetric Psych: well oriented with normal range of affect and appropriate judgment/insight  Assessment and Plan  Well adult exam - Plan: CBC, Comprehensive metabolic panel, Lipid panel  Screening for HIV (human immunodeficiency virus) - Plan: HIV antibody  Encounter for  hepatitis C screening test for low risk patient - Plan: Hepatitis C antibody  Screening for prostate cancer - Plan: PSA   Well 66 y.o. male. Counseled on diet and exercise. Counseled on risk and benefits of prostate cancer screening. The pt wishes to proceed.  Immunizations, labs, and further orders as above. Follow up in 1 year pending above. The patient voiced understanding and agreement to the plan.  [place], DO 09/24/16 10:06 AM

## 2016-09-24 NOTE — Progress Notes (Signed)
Pre visit review using our clinic review tool, if applicable. No additional management support is needed unless otherwise documented below in the visit note. 

## 2016-09-25 LAB — HIV ANTIBODY (ROUTINE TESTING W REFLEX): HIV: NONREACTIVE

## 2017-03-01 ENCOUNTER — Ambulatory Visit: Payer: BC Managed Care – PPO | Admitting: Family Medicine

## 2017-03-01 ENCOUNTER — Encounter: Payer: Self-pay | Admitting: Family Medicine

## 2017-03-01 VITALS — BP 130/82 | HR 80 | Temp 98.3°F | Ht 74.0 in | Wt 211.4 lb

## 2017-03-01 DIAGNOSIS — R05 Cough: Secondary | ICD-10-CM

## 2017-03-01 DIAGNOSIS — R059 Cough, unspecified: Secondary | ICD-10-CM

## 2017-03-01 MED ORDER — HYDROCODONE-HOMATROPINE 5-1.5 MG/5ML PO SYRP: 5.0000 mL | ORAL_SOLUTION | Freq: Three times a day (TID) | ORAL | 0 refills | Status: DC | PRN

## 2017-03-01 MED ORDER — METHYLPREDNISOLONE ACETATE 80 MG/ML IJ SUSP
80.0000 mg | Freq: Once | INTRAMUSCULAR | Status: AC
  Administered 2017-03-01: 80 mg via INTRAMUSCULAR

## 2017-03-01 MED ORDER — LEVOCETIRIZINE DIHYDROCHLORIDE 5 MG PO TABS: 5.0000 mg | ORAL_TABLET | Freq: Every evening | ORAL | 2 refills | Status: DC

## 2017-03-01 NOTE — Progress Notes (Signed)
Pre visit review using our clinic review tool, if applicable. No additional management support is needed unless otherwise documented below in the visit note. 

## 2017-03-01 NOTE — Progress Notes (Signed)
Chief Complaint  Patient presents with  . Cough    James [name] here for URI complaints.  Duration: 10 days  Associated symptoms: cough, post nasal drainage Denies: sinus congestion, itchy watery eyes, ear pain, ear drainage, sore throat, wheezing, shortness of breath, myalgia and fevers/rigors Treatment to date: Ny/Dayquil, Robitussin, Tessalon Perles Sick contacts: No  ROS:  Const: Denies fevers HEENT: As noted in HPI Lungs: No SOB  Past Medical History:  Diagnosis Date  . No known problems    Family History  Problem Relation Age of Onset  . Stroke Mother 6  . Heart attack Father 59  . Cancer Neg Hx   . Diabetes Neg Hx   . Colon cancer Neg Hx   . Colon polyps Neg Hx     BP 130/82 (BP Location: Left Arm, Patient Position: Sitting, Cuff Size: Large)   Pulse 80   Temp 98.3 F (36.8 C) (Oral)   Ht 6\' 2"  (1.88 m)   Wt 211 lb 6 oz (95.9 kg)   SpO2 97%   BMI 27.14 kg/m  General: Awake, alert, appears stated age HEENT: AT, [place], ears patent b/l and TM's neg, nares patent w/o discharge, pharynx pink and without exudates, MMM Neck: No masses or asymmetry Heart: RRR Lungs: CTAB, no accessory muscle use Psych: Age appropriate judgment and insight, normal mood and affect  Cough - Plan: HYDROcodone-homatropine (HYCODAN) 5-1.5 MG/5ML syrup, levocetirizine (XYZAL) 5 MG tablet, methylPREDNISolone acetate (DEPO-MEDROL) injection 80 mg  Orders as above. Avoid anything that requires alertness and alcohol while on syrup.  Considering allergic etiology given recurrent timing.  Call in 3-4 days if no better. Continue to push fluids, practice good hand hygiene, cover mouth when coughing. F/u prn. If starting to experience fevers, shaking, or shortness of breath, seek immediate care. Pt voiced understanding and agreement to the plan.  [place], DO 03/01/17 10:41 AM

## 2017-03-01 NOTE — Patient Instructions (Addendum)
Continue to push fluids, practice good hand hygiene, and cover your mouth if you cough.  If you start having fevers, shaking or shortness of breath, seek immediate care.  Continue nasal spray.  Let me know if things aren't getting better by Thursday or Friday.  Do not drink alcohol, do any illicit/street drugs, drive or do anything that requires alertness while on this medicine.

## 2017-03-03 ENCOUNTER — Other Ambulatory Visit: Payer: Self-pay | Admitting: Family Medicine

## 2017-03-03 ENCOUNTER — Encounter: Payer: Self-pay | Admitting: Family Medicine

## 2017-03-03 MED ORDER — ALBUTEROL SULFATE HFA 108 (90 BASE) MCG/ACT IN AERS: 1.0000 | INHALATION_SPRAY | Freq: Four times a day (QID) | RESPIRATORY_TRACT | 1 refills | Status: DC | PRN

## 2017-12-28 ENCOUNTER — Ambulatory Visit: Payer: BC Managed Care – PPO | Admitting: Family Medicine

## 2017-12-28 ENCOUNTER — Encounter: Payer: Self-pay | Admitting: Family Medicine

## 2017-12-28 VITALS — BP 142/86 | HR 73 | Temp 98.2°F | Ht 74.0 in | Wt 209.2 lb

## 2017-12-28 DIAGNOSIS — Z23 Encounter for immunization: Secondary | ICD-10-CM

## 2017-12-28 DIAGNOSIS — S81812A Laceration without foreign body, left lower leg, initial encounter: Secondary | ICD-10-CM

## 2017-12-28 NOTE — Progress Notes (Signed)
Chief Complaint  Patient presents with  . Laceration    left leg    James [name] is a 67 y.o. male here for a skin complaint.  Duration: 3 days Location: L leg Pruritic? No Painful? Yes Drainage? Yes Cut leg w axe.  Therapies tried thus far: none  ROS:  Const: No fevers Skin: As noted in HPI  Past Medical History:  Diagnosis Date  . No known problems     BP (!) 142/86 (BP Location: Left Arm, Patient Position: Sitting, Cuff Size: Large)   Pulse 73   Temp 98.2 F (36.8 C) (Oral)   Ht 6\' 2"  (1.88 m)   Wt 209 lb 4 oz (94.9 kg)   SpO2 98%   BMI 26.87 kg/m  Gen: awake, alert, appearing stated age Lungs: No accessory muscle use Skin: See below. No drainage, erythema, TTP, fluctuance, excoriation Psych: Age appropriate judgment and insight      Laceration of left lower extremity, initial encounter  Keep c/d, TAO bid for 7 days. Warning signs and symptoms verbalized and written down in AVS. UTD w tetanus. Flu shot today. F/u prn. The patient voiced understanding and agreement to the plan.  [place], DO 12/28/17 4:18 PM

## 2017-12-28 NOTE — Patient Instructions (Addendum)
Apply triple antibiotic ointment (like Neosporin) twice daily over next week.  When you do wash it, use only soap and water. Do not vigorously scrub. Apply triple antibiotic ointment (like Neosporin) twice daily. Keep the area clean and dry when not cleaning.   Things to look out for: increasing pain not relieved by ibuprofen/acetaminophen, fevers, spreading redness, drainage of pus, or foul odor.   You are up to date with your tetanus shot.  Be careful when handling axes.   Let us know if you need anything.

## 2017-12-28 NOTE — Progress Notes (Signed)
Pre visit review using our clinic review tool, if applicable. No additional management support is needed unless otherwise documented below in the visit note. 

## 2018-01-12 ENCOUNTER — Ambulatory Visit (INDEPENDENT_AMBULATORY_CARE_PROVIDER_SITE_OTHER): Payer: Medicare Other | Admitting: Family Medicine

## 2018-01-12 ENCOUNTER — Encounter: Payer: Self-pay | Admitting: Family Medicine

## 2018-01-12 VITALS — BP 132/80 | HR 89 | Temp 98.9°F | Ht 74.0 in | Wt 207.0 lb

## 2018-01-12 DIAGNOSIS — J40 Bronchitis, not specified as acute or chronic: Secondary | ICD-10-CM | POA: Diagnosis not present

## 2018-01-12 MED ORDER — METHYLPREDNISOLONE ACETATE 80 MG/ML IJ SUSP
80.0000 mg | Freq: Once | INTRAMUSCULAR | Status: AC
  Administered 2018-01-12: 80 mg via INTRAMUSCULAR

## 2018-01-12 MED ORDER — BENZONATATE 100 MG PO CAPS: 100.0000 mg | ORAL_CAPSULE | Freq: Three times a day (TID) | ORAL | 0 refills | Status: DC | PRN

## 2018-01-12 MED ORDER — AZITHROMYCIN 250 MG PO TABS: ORAL_TABLET | ORAL | 0 refills | Status: DC

## 2018-01-12 NOTE — Patient Instructions (Addendum)
Continue to push fluids, practice good hand hygiene, and cover your mouth if you cough.  If you start having fevers, shaking or shortness of breath, seek immediate care.  For symptoms, consider using Vick's VapoRub on chest or under nose, air humidifier, Benadryl at night, and elevating the head of the bed. Tylenol and ibuprofen for aches and pains you may be experiencing.   Don't take antibiotic unless you have a fever. If no better by next Friday, OK to take.  Let us know if you need anything.

## 2018-01-12 NOTE — Progress Notes (Signed)
Pre visit review using our clinic review tool, if applicable. No additional management support is needed unless otherwise documented below in the visit note. 

## 2018-01-12 NOTE — Addendum Note (Signed)
Addended by: Sharon Seller B on: 01/12/2018 09:40 AM   Modules accepted: Orders

## 2018-01-12 NOTE — Progress Notes (Signed)
Chief Complaint  Patient presents with  . Cough    head congestion    James [name] here for URI complaints.  Duration: 5 days  Associated symptoms: subjective fever, sinus congestion, rhinorrhea and cough Denies: sinus pain, itchy watery eyes, ear fullness, ear pain, ear drainage, sore throat, wheezing and shortness of breath Treatment to date: Tylenol, Dayquil, Nyquil, robitussin, INCS Sick contacts: No  ROS:  HEENT: As noted in HPI Lungs: No SOB  Past Medical History:  Diagnosis Date  . No known problems     BP 132/80 (BP Location: Left Arm, Patient Position: Sitting, Cuff Size: Normal)   Pulse 89   Temp 98.9 F (37.2 C) (Oral)   Ht 6\' 2"  (1.88 m)   Wt 207 lb (93.9 kg)   SpO2 97%   BMI 26.58 kg/m  General: Awake, alert, appears stated age HEENT: AT, [place], ears patent b/l and TM's neg, nares patent w/o discharge, pharynx pink and without exudates, MMM Neck: No masses or asymmetry Heart: RRR Lungs: CTAB, no accessory muscle use Psych: Age appropriate judgment and insight, normal mood and affect  Bronchitis - Plan: azithromycin (ZITHROMAX) 250 MG tablet, benzonatate (TESSALON) 100 MG capsule  Orders as above. Ck temp at home. OK to take Zpak if fever or if no better in 9 d. Depomedrol today. Continue to push fluids, practice good hand hygiene, cover mouth when coughing. F/u prn. If starting to experience fevers, shaking, or shortness of breath, seek immediate care. Pt voiced understanding and agreement to the plan.  [place], DO 01/12/18 9:32 AM

## 2018-04-11 ENCOUNTER — Encounter: Payer: Self-pay | Admitting: Family Medicine

## 2018-04-11 ENCOUNTER — Ambulatory Visit (INDEPENDENT_AMBULATORY_CARE_PROVIDER_SITE_OTHER): Payer: Medicare Other | Admitting: Family Medicine

## 2018-04-11 ENCOUNTER — Ambulatory Visit (HOSPITAL_BASED_OUTPATIENT_CLINIC_OR_DEPARTMENT_OTHER)
Admission: RE | Admit: 2018-04-11 | Discharge: 2018-04-11 | Disposition: A | Discharge status: Home / Self Care | Payer: Medicare Other | Source: Ambulatory Visit | Attending: Family Medicine | Admitting: Family Medicine

## 2018-04-11 VITALS — BP 140/80 | HR 105 | Temp 98.2°F | Ht 74.0 in | Wt 204.0 lb

## 2018-04-11 DIAGNOSIS — S76319A Strain of muscle, fascia and tendon of the posterior muscle group at thigh level, unspecified thigh, initial encounter: Secondary | ICD-10-CM | POA: Diagnosis not present

## 2018-04-11 DIAGNOSIS — M7918 Myalgia, other site: Secondary | ICD-10-CM

## 2018-04-11 DIAGNOSIS — M160 Bilateral primary osteoarthritis of hip: Secondary | ICD-10-CM | POA: Diagnosis not present

## 2018-04-11 NOTE — Patient Instructions (Addendum)
Heat (pad or rice pillow in microwave) over affected area, 10-15 minutes twice daily.   Ice/cold pack over area for 10-15 min twice daily.  OK to take Tylenol 1000 mg (2 extra strength tabs) or 975 mg (3 regular strength tabs) every 6 hours as needed.  Ibuprofen 400-600 mg (2-3 over the counter strength tabs) every 6 hours as needed for pain.  Send me a message in 3-4 weeks if no improvement. We will set you up with physical therapy or our sports medicine team.   Hamstring Rehab  It is normal to feel mild stretching, pulling, tightness, or discomfort as you do these exercises, but you should stop right away if you feel sudden pain or your pain gets worse.  Stretching and range of motion exercises These exercises warm up your muscles and joints and improve the movement and flexibility of your thigh. These exercises also help to relieve pain, numbness, and tingling. Exercise A: Hamstring stretch, supine    1. Lie on your back. Loop a belt or towel across the ball of your left / right foot The ball of your foot is on the walking surface, right under your toes. 2. Straighten your left / right knee and slowly pull on the belt to raise your leg. Stop when you feel a gentle stretch behind your left / right knee or thigh. ? Do not allow the knee to bend. ? Keep your other leg flat on the floor. 3. Hold this position for 30 seconds. Repeat 2 times. Complete this exercise 3 times a week. Strengthening exercises These exercises build strength and endurance in your thigh. Endurance is the ability to use your muscles for a long time, even after they get tired. Exercise B: Straight leg raises (hip extensors) 1. Lie on your belly on a bed or a firm surface with a pillow under your hips. 2. Bend your left / right knee so your foot is straight up in the air. 3. Squeeze your buttock muscles and lift your left / right thigh off the bed. Do not let your back arch. 4. Hold this position for 3  seconds. 5. Slowly return to the starting position. Let your muscles relax completely before you do another repetition. Repeat 2 times. Complete this exercise 3 times a week. Exercise C: Bridge (hip extensors)     1. Lie on your back on a firm surface with your knees bent and your feet flat on the floor. 2. Tighten your buttocks muscles and lift your bottom off the floor until your trunk is level with your thighs. ? You should feel the muscles working in your buttocks and the back of your thighs. If you do not feel these muscles, slide your feet 1-2 inches (2.5-5 cm) farther away from your buttocks. ? Do not arch your back. 3. Hold this position for 3 seconds. 4. Slowly lower your hips to the starting position. 5. Let your buttocks muscles relax completely between repetitions. If this exercise is too easy, try doing it with your arms crossed over your chest. Repeat 2 times. Complete this exercise 3 times a week. Exercise D: Hamstring eccentric, prone 1. Lie on your belly on a bed or on the floor. 2. Start with your legs straight. Cross your legs at the ankles with your left / right leg on top. 3. Using your bottom leg to do the work, bend both knees. 4. Using just your left / right leg alone, slowly lower your leg back down toward the bed.  Add a 5 lb weight as told by your health care provider. 5. Let your muscles relax completely between repetitions. Repeat 2 times. Complete this exercise 3 times a week. Exercise E: Squats 1. Stand in front of a table, with your feet and knees pointing straight ahead. You may rest your hands on the table for balance but not for support. 2. Slowly bend your knees and lower your hips like you are going to sit in a chair. Keep your thighs straight or pointed slightly outward. ? Keep your weight over your heels, not over your toes. ? Keep your lower legs upright so they are parallel with the table legs. ? Do not let your hips go lower than your knees. Stop  when your knees are bent to the shape of an upside-down letter "L" (90 degree angle). ? Do not bend lower than told by your health care provider. ? If your knee pain increases, do not bend as low. 3. Hold the squat position 1-2 seconds. 4. Slowly push with your legs to return to standing. Do not use your hands to pull yourself to standing. Repeat 2 times. Complete this exercise 3 times a week. Make sure you discuss any questions you have with your health care provider. Document Released: 01/19/2005 Document Revised: 09/26/2015 Document Reviewed: 10/23/2014 Elsevier Interactive Patient Education  [employer].

## 2018-04-11 NOTE — Progress Notes (Signed)
Musculoskeletal Exam  Patient: James [name] DOB: 1950/08/14  DOS: 04/11/2018  SUBJECTIVE:  Chief Complaint:   Chief Complaint  Patient presents with  . Follow-up    Butt pain    James [name] is a 68 y.o.  male for evaluation and treatment of buttock pain.   Onset:  7 weeks ago. No inj or change in activity.  Location: "butt bone" b/l Character:  aching and sharp  Progression of issue:  is unchanged Associated symptoms: hurts to sit; no bruising, swelling Treatment: to date has been OTC NSAIDS.   Neurovascular symptoms: no  ROS: Musculoskeletal/Extremities: +buttock pain  Past Medical History:  Diagnosis Date  . No known problems     Objective: VITAL SIGNS: BP 140/80 (BP Location: Left Arm, Patient Position: Sitting, Cuff Size: Normal)   Pulse (!) 105   Temp 98.2 F (36.8 C) (Oral)   Ht 6\' 2"  (1.88 m)   Wt 204 lb (92.5 kg)   SpO2 97%   BMI 26.19 kg/m  Constitutional: Well formed, well developed. No acute distress. Cardiovascular: Brisk cap refill Thorax & Lungs: No accessory muscle use Musculoskeletal: hamstrings.   Normal active range of motion: yes.   Normal passive range of motion: yes Tenderness to palpation: ttp over b/l ischial tuberosities and prox hamstrings Deformity: no Ecchymosis: no Tests positive: none Tests negative: none Neurologic: Normal sensory function. No focal deficits noted. DTR's equal and symmetric in LE's. No clonus. Psychiatric: Normal mood. Age appropriate judgment and insight. Alert & oriented x 3.    Assessment:  Hamstring strain, unspecified laterality, initial encounter - Plan: DG HIPS BILAT W OR W/O PELVIS 3-4 VIEWS  Buttock pain  Plan: Check x-rays of ischial tuberosities bilaterally, I question whether it is a proximal hamstring strain that is causing this issue.  Rehab exercises given.  If no improvement, he will message [place] in 3-4 weeks and we will consider physical therapy versus referral to the sports medicine  team. The patient voiced understanding and agreement to the plan.   [place], DO 04/11/18  5:00 PM

## 2018-09-20 ENCOUNTER — Ambulatory Visit (INDEPENDENT_AMBULATORY_CARE_PROVIDER_SITE_OTHER): Payer: Medicare Other | Admitting: Family Medicine

## 2018-09-20 ENCOUNTER — Other Ambulatory Visit: Payer: Self-pay

## 2018-09-20 ENCOUNTER — Encounter: Payer: Self-pay | Admitting: Family Medicine

## 2018-09-20 VITALS — BP 140/82 | HR 81 | Temp 97.6°F | Ht 74.0 in | Wt 206.1 lb

## 2018-09-20 DIAGNOSIS — Z23 Encounter for immunization: Secondary | ICD-10-CM | POA: Diagnosis not present

## 2018-09-20 DIAGNOSIS — Z0000 Encounter for general adult medical examination without abnormal findings: Secondary | ICD-10-CM

## 2018-09-20 LAB — COMPREHENSIVE METABOLIC PANEL
ALT: 15 U/L (ref 0–53)
AST: 17 U/L (ref 0–37)
Albumin: 4.6 g/dL (ref 3.5–5.2)
Alkaline Phosphatase: 55 U/L (ref 39–117)
BUN: 17 mg/dL (ref 6–23)
CO2: 29 mEq/L (ref 19–32)
Calcium: 9.6 mg/dL (ref 8.4–10.5)
Chloride: 103 mEq/L (ref 96–112)
Creatinine, Ser: 1.08 mg/dL (ref 0.40–1.50)
GFR: 68.47 mL/min (ref >60.00)
Glucose, Bld: 104 mg/dL — ABNORMAL HIGH (ref 70–99)
Potassium: 4.3 mEq/L (ref 3.5–5.1)
Sodium: 140 mEq/L (ref 135–145)
Total Bilirubin: 0.8 mg/dL (ref 0.2–1.2)
Total Protein: 7 g/dL (ref 6.0–8.3)

## 2018-09-20 LAB — CBC
HCT: 42.8 % (ref 39.0–52.0)
Hemoglobin: 14.9 g/dL (ref 13.0–17.0)
MCHC: 34.9 g/dL (ref 30.0–36.0)
MCV: 82.8 fl (ref 78.0–100.0)
Platelets: 195 10*3/uL (ref 150.0–400.0)
RBC: 5.16 Mil/uL (ref 4.22–5.81)
RDW: 14.8 % (ref 11.5–15.5)
WBC: 4.8 10*3/uL (ref 4.0–10.5)

## 2018-09-20 LAB — LIPID PANEL
Cholesterol: 146 mg/dL (ref 0–200)
HDL: 55.4 mg/dL (ref >39.00)
LDL Cholesterol: 65 mg/dL (ref 0–99)
NonHDL: 91.05
Total CHOL/HDL Ratio: 3
Triglycerides: 130 mg/dL (ref 0.0–149.0)
VLDL: 26 mg/dL (ref 0.0–40.0)

## 2018-09-20 NOTE — Progress Notes (Signed)
Chief Complaint  Patient presents with  . Annual Exam    Well Male James [name] is here for a complete physical.   His last physical was >1 year ago.  Current diet: in general, a "healthy" diet.   Current exercise: walking Weight trend: stable Daytime fatigue? No. Seat belt? Yes.    Health maintenance Shingrix- No Colonoscopy- Yes Tetanus- Yes Hep C- Yes Pneumonia vaccine- No  Past Medical History:  Diagnosis Date  . No known problems      Past Surgical History:  Procedure Laterality Date  . COLONOSCOPY  2006   Neg, Dr.Gessner  . VASECTOMY      Medications  Current Outpatient Medications on File Prior to Visit  Medication Sig Dispense Refill  . Krill Oil 1000 MG CAPS Take by mouth.    . Misc Natural Products (OSTEO BI-FLEX ADV JOINT SHIELD PO) Take 1 capsule by mouth daily.     No current facility-administered medications on file prior to visit.      Allergies No Known Allergies  Family History Family History  Problem Relation Age of Onset  . Stroke Mother 10  . Heart attack Father 20  . Cancer Neg Hx   . Diabetes Neg Hx   . Colon cancer Neg Hx   . Colon polyps Neg Hx     Review of Systems: Constitutional:  no fevers or chills Eye:  no recent significant change in vision Ear/Nose/Mouth/Throat:  Ears:  no recent hearing loss Nose/Mouth/Throat:  no complaints of nasal congestion or sore throat Cardiovascular:  no chest pain, no palpitations Respiratory:  no cough and no shortness of breath Gastrointestinal:  no abdominal pain, no change in bowel habits GU:  Male: negative for dysuria, frequency, and incontinence and negative for prostate symptoms Musculoskeletal/Extremities: +R hip pain; +buttock pain; otherwise no pain, redness, or swelling of the joints Integumentary (Skin):  no abnormal skin lesions reported Neurologic:  no headaches, Endocrine:  No unexpected weight changes Hematologic/Lymphatic:  no areas of easy bruising  Exam BP 140/82  (BP Location: Left Arm, Patient Position: Sitting, Cuff Size: Normal)   Pulse 81   Temp 97.6 F (36.4 C) (Temporal)   Ht 6\' 2"  (1.88 m)   Wt 206 lb 2 oz (93.5 kg)   SpO2 97%   BMI 26.46 kg/m  General:  well developed, well nourished, in no apparent distress Skin:  no significant moles, warts, or growths Head:  no masses, lesions, or tenderness Eyes:  pupils equal and round, sclera anicteric without injection Ears:  canals without lesions, TMs shiny without retraction, no obvious effusion, no erythema Nose:  nares patent, septum midline, mucosa normal Throat/Pharynx:  lips and gingiva without lesion; tongue and uvula midline; non-inflamed pharynx; no exudates or postnasal drainage Neck: neck supple without adenopathy, thyromegaly, or masses Lungs:  clear to auscultation, breath sounds equal bilaterally, no respiratory distress Cardio:  regular rate and rhythm, no LE edema or bruits Abdomen:  abdomen soft, nontender; bowel sounds normal; no masses or organomegaly Rectal: Deferred Musculoskeletal: +ttp over R greater troch bursa; neg Ober's; otherwise symmetrical muscle groups noted without atrophy or deformity Extremities:  no clubbing, cyanosis, or edema, no deformities, no skin discoloration Neuro:  gait normal; deep tendon reflexes normal and symmetric Psych: well oriented with normal range of affect and appropriate judgment/insight  Assessment and Plan  Well adult exam - Plan: CBC, Comprehensive metabolic panel, Lipid panel  Well 68 y.o. male. Counseled on diet and exercise. IT band stretches/exercises given. NSAIDs,  Tylenol, ice. If no improvement, can inject or get set up with PT, same with buttock regarding PT. Other orders as above. Follow up in 1 yr for CPE or prn.  The patient voiced understanding and agreement to the plan.  [place], DO 09/20/18 1:52 PM

## 2018-09-20 NOTE — Addendum Note (Signed)
Addended by: Sharon Seller B on: 09/20/2018 02:04 PM   Modules accepted: Orders

## 2018-09-20 NOTE — Patient Instructions (Addendum)
Give [place] 2-3 business days to get the results of your labs back.   Keep the diet clean and stay active.  I recommend getting the flu shot in mid October. This suggestion would change if the CDC comes out with a different recommendation.   The new Shingrix vaccine (for shingles) is a 2 shot series. It can make people feel low energy, achy and almost like they have the flu for 48 hours after injection. Please plan accordingly when deciding on when to get this shot. Call our office for a nurse visit appointment to get this. The second shot of the series is less severe regarding the side effects, but it still lasts 48 hours.   Keep me up to date with your hip/buttock pain. We can inject the hip or get you set up with physical therapy for the buttock.    Let us know if you need anything.  Iliotibial Band Syndrome Rehab It is normal to feel mild stretching, pulling, tightness, or discomfort as you do these exercises, but you should stop right away if you feel sudden pain or your pain gets worse.  Stretching and range of motion exercises These exercises warm up your muscles and joints and improve the movement and flexibility of your hip and pelvis. Exercise A: Quadriceps, prone    1. Lie on your abdomen on a firm surface, such as a bed or padded floor. 2. Bend your left / right knee and hold your ankle. If you cannot reach your ankle or pant leg, loop a belt around your foot and grab the belt instead. 3. Gently pull your heel toward your buttocks. Your knee should not slide out to the side. You should feel a stretch in the front of your thigh and knee. 4. Hold this position for 30 seconds. Repeat 2 times. Complete this stretch 3 times per week. Exercise B: Iliotibial band    1. Lie on your side with your left / right leg in the top position. 2. Bend both of your knees and grab your left / right ankle. Stretch out your bottom arm to help you balance. 3. Slowly bring your top knee back so your  thigh goes behind your trunk. 4. Slowly lower your top leg toward the floor until you feel a gentle stretch on the outside of your left / right hip and thigh. If you do not feel a stretch and your knee will not fall farther, place the heel of your other foot on top of your knee and pull your knee down toward the floor with your foot. 5. Hold this position for 30 seconds. Repeat 2 times. Complete this stretch 3 times per week. Strengthening exercises These exercises build strength and endurance in your hip and pelvis. Endurance is the ability to use your muscles for a long time, even after they get tired. Exercise C: Straight leg raises (hip abductors)     1. Lie on your side with your left / right leg in the top position. Lie so your head, shoulder, knee, and hip line up. You may bend your bottom knee to help you balance. 2. Roll your hips slightly forward so your hips are stacked directly over each other and your left / right knee is facing forward. 3. Tense the muscles in your outer thigh and lift your top leg 4-6 inches (10-15 cm). 4. Hold this position for 3 seconds. Repeat for a total of 10 reps. 5. Slowly return to the starting position. Let your muscles  relax completely before doing another repetition. Repeat 2 times. Complete this exercise 3 times per week. Exercise D: Straight leg raises (hip extensors) 1. Lie on your abdomen on your bed or a firm surface. You can put a pillow under your hips if that is more comfortable. 2. Bend your left / right knee so your foot is straight up in the air. 3. Squeeze your buttock muscles and lift your left / right thigh off the bed. Do not let your back arch. 4. Tense this muscle as hard as you can without increasing any knee pain. 5. Hold this position for 2 seconds. Repeat for a total of 10 reps 6. Slowly lower your leg to the starting position and allow it to relax completely. Repeat 2 times. Complete this exercise 3 times per week. Exercise E:  Hip hike 1. Stand sideways on a bottom step. Stand on your left / right leg with your other foot unsupported next to the step. You can hold onto the railing or wall if needed for balance. 2. Keep your knees straight and your torso square. Then, lift your left / right hip up toward the ceiling. 3. Slowly let your left / right hip lower toward the floor, past the starting position. Your foot should get closer to the floor. Do not lean or bend your knees. Repeat 2 times. Complete this exercise 3 times per week.  Document Released: 01/19/2005 Document Revised: 09/24/2015 Document Reviewed: 12/21/2014 Elsevier Interactive Patient Education  [employer].

## 2018-10-17 ENCOUNTER — Encounter: Payer: Self-pay | Admitting: Family Medicine

## 2018-10-17 ENCOUNTER — Other Ambulatory Visit: Payer: Self-pay

## 2018-10-18 ENCOUNTER — Encounter: Payer: Self-pay | Admitting: Internal Medicine

## 2018-10-18 ENCOUNTER — Ambulatory Visit (INDEPENDENT_AMBULATORY_CARE_PROVIDER_SITE_OTHER): Payer: Medicare Other | Admitting: Internal Medicine

## 2018-10-18 VITALS — BP 146/88 | HR 64 | Temp 97.3°F | Resp 16 | Ht 74.0 in | Wt 201.0 lb

## 2018-10-18 DIAGNOSIS — S61412A Laceration without foreign body of left hand, initial encounter: Secondary | ICD-10-CM

## 2018-10-18 DIAGNOSIS — Z23 Encounter for immunization: Secondary | ICD-10-CM

## 2018-10-18 MED ORDER — CEPHALEXIN 500 MG PO CAPS: 500.0000 mg | ORAL_CAPSULE | Freq: Four times a day (QID) | ORAL | 0 refills | Status: DC

## 2018-10-18 NOTE — Patient Instructions (Signed)
Make an appointment to see Dr. Nani Ravens 10 days from today.  Clean the area with soap and water  Tat it dry  Use over-the-counter antibiotic ointment  Cover with a Band-Aid  Change Band-Aids once or twice a day  Avoid salt water  Call if you see any infection (redness, discharge, fever, chills)  Start antibiotic called Keflex

## 2018-10-18 NOTE — Progress Notes (Signed)
Subjective:    Patient ID: James [name], male    DOB: 1950/07/06, 68 y.o.   MRN: [id number]  DOS:  10/18/2018 Type of visit - description: acute  Accident happened 3 days ago, was working in his backyard and cut the left hand with sheet of tin. Did not seek medical attention Area bled mildly for few days but not today. He denies any fever chills No discharge Immunizations reviewed    Review of Systems   Past Medical History:  Diagnosis Date  . No known problems     Past Surgical History:  Procedure Laterality Date  . COLONOSCOPY  2006   Neg, Dr.Gessner  . VASECTOMY      Social History   Socioeconomic History  . Marital status: Married    Spouse name: Not on file  . Number of children: Not on file  . Years of education: Not on file  . Highest education level: Not on file  Occupational History  . Not on file  Social Needs  . Financial resource strain: Not on file  . Food insecurity    Worry: Not on file    Inability: Not on file  . Transportation needs    Medical: Not on file    Non-medical: Not on file  Tobacco Use  . Smoking status: Never Smoker  . Smokeless tobacco: Never Used  Substance and Sexual Activity  . Alcohol use: Yes    Alcohol/week: 0.0 standard drinks    Comment: 3 Beer/week  . Drug use: No  . Sexual activity: Not on file  Lifestyle  . Physical activity    Days per week: Not on file    Minutes per session: Not on file  . Stress: Not on file  Relationships  . Social Herbalist on phone: Not on file    Gets together: Not on file    Attends religious service: Not on file    Active member of club or organization: Not on file    Attends meetings of clubs or organizations: Not on file    Relationship status: Not on file  . Intimate partner violence    Fear of current or ex partner: Not on file    Emotionally abused: Not on file    Physically abused: Not on file    Forced sexual activity: Not on file  Other Topics Concern   . Not on file  Social History Narrative  . Not on file      Allergies as of 10/18/2018   No Known Allergies     Medication List       Accurate as of October 18, 2018 11:32 AM. If you have any questions, ask your nurse or doctor.        cephALEXin 500 MG capsule Commonly known as: KEFLEX Take 1 capsule (500 mg total) by mouth 4 (four) times daily. Started by: Kathlene November, MD   Krill Oil 1000 MG Caps Take by mouth.   OSTEO BI-FLEX ADV JOINT SHIELD PO Take 1 capsule by mouth daily.           Objective:   Physical Exam BP (!) 146/88 (BP Location: Left Arm, Patient Position: Sitting, Cuff Size: Normal)   Pulse 64   Temp (!) 97.3 F (36.3 C) (Temporal)   Resp 16   Ht 6\' 2"  (1.88 m)   Wt 201 lb (91.2 kg)   SpO2 99%   BMI 25.81 kg/m    See picture, wound without  redness, discharge or odor. He moves all fingers without problems, good capillary refill on all of them.     Assessment    68 year old gentleman, healthy, presents with Laceration, left hand: No evidence of infection today.  Updated on his Td. He plans to go to the beach in few days. The wound was cleaned w/ H202 and a medium size band aid placed by my nurse Plan: Wound care discussed, clean with soap and water, use an antibiotic ointment, see AVS Empiric Keflex Recheck in 10 days by PCP. Preventive care: Flu shot provided

## 2018-10-18 NOTE — Progress Notes (Signed)
Pre visit review using our clinic review tool, if applicable. No additional management support is needed unless otherwise documented below in the visit note. 

## 2018-10-31 ENCOUNTER — Ambulatory Visit: Payer: Medicare Other | Admitting: Family Medicine

## 2019-06-01 ENCOUNTER — Emergency Department (INDEPENDENT_AMBULATORY_CARE_PROVIDER_SITE_OTHER)
Admission: EM | Admit: 2019-06-01 | Discharge: 2019-06-01 | Disposition: A | Discharge status: Home / Self Care | Payer: Medicare Other | Source: Home / Self Care | Attending: Family Medicine | Admitting: Family Medicine

## 2019-06-01 ENCOUNTER — Other Ambulatory Visit: Payer: Self-pay

## 2019-06-01 ENCOUNTER — Encounter: Payer: Self-pay | Admitting: Emergency Medicine

## 2019-06-01 DIAGNOSIS — S91312A Laceration without foreign body, left foot, initial encounter: Secondary | ICD-10-CM | POA: Diagnosis not present

## 2019-06-01 NOTE — ED Provider Notes (Signed)
[name]    CSN: [id number] Arrival date & time: 06/01/19  [id number]      History   Chief Complaint Chief Complaint  Patient presents with  . Extremity Laceration    HPI ABRAHEEM MARCK is a 69 y.o. male.   Patient lacerated the lateral edge of his left foot on sheet metal about 1.5 hours ago.  His Tdap is current.  The history is provided by the patient.  Laceration Location:  Foot Foot laceration location:  L foot Length:  2.5cm Depth:  Through underlying tissue Quality: straight   Bleeding: controlled   Laceration mechanism:  Metal edge Pain details:    Quality:  Aching   Severity:  Mild   Timing:  Constant   Progression:  Improving Foreign body present:  No foreign bodies Relieved by:  Nothing Worsened by:  Movement Ineffective treatments:  None tried Tetanus status:  Up to date Associated symptoms: no numbness and no swelling     Past Medical History:  Diagnosis Date  . No known problems     Patient Active Problem List   Diagnosis Date Noted  . Family history of premature coronary artery disease 01/12/2014  . Rosacea 01/12/2014  . Perennial allergic rhinitis 01/12/2014  . BPH without obstruction/lower urinary tract symptoms 03/18/2010  . [place] SYNDROME 11/23/2006    Past Surgical History:  Procedure Laterality Date  . COLONOSCOPY  2006   Neg, Dr.Gessner  . VASECTOMY         Home Medications    Prior to Admission medications   Medication Sig Start Date End Date Taking? Authorizing Provider  Krill Oil 1000 MG CAPS Take by mouth.    [provider]  Misc Natural Products (OSTEO BI-FLEX ADV JOINT SHIELD PO) Take 1 capsule by mouth daily.    [provider]    Family History Family History  Problem Relation Age of Onset  . Stroke Mother 95  . Heart attack Father 84  . Cancer Neg Hx   . Diabetes Neg Hx   . Colon cancer Neg Hx   . Colon polyps Neg Hx     Social History Social History   Tobacco Use    . Smoking status: Never Smoker  . Smokeless tobacco: Never Used  Substance Use Topics  . Alcohol use: Yes    Alcohol/week: 0.0 standard drinks    Comment: 3 Beer/week  . Drug use: No     Allergies   Patient has no known allergies.   Review of Systems Review of Systems  Skin: Positive for wound.  All other systems reviewed and are negative.    Physical Exam Triage Vital Signs ED Triage Vitals  Enc Vitals Group     BP 06/01/19 1915 (!) 186/97     Pulse Rate 06/01/19 1915 91     Resp --      Temp 06/01/19 1915 99.5 F (37.5 C)     Temp Source 06/01/19 1915 Oral     SpO2 --      Weight 06/01/19 1916 200 lb (90.7 kg)     Height 06/01/19 1916 6\' 2"  (1.88 m)     Head Circumference --      Peak Flow --      Pain Score 06/01/19 1916 0     Pain Loc --      Pain Edu? --      Excl. in [place]? --    No data found.  Updated Vital Signs BP Marland Kitchen)  186/97 (BP Location: Right Arm)   Pulse 91   Temp 99.5 F (37.5 C) (Oral)   Ht 6\' 2"  (1.88 m)   Wt 90.7 kg   BMI 25.68 kg/m   Visual Acuity Right Eye Distance:   Left Eye Distance:   Bilateral Distance:    Right Eye Near:   Left Eye Near:    Bilateral Near:     Physical Exam Vitals and nursing note reviewed.  Constitutional:      General: He is not in acute distress. HENT:     Head: Normocephalic.  Eyes:     Pupils: Pupils are equal, round, and reactive to light.  Cardiovascular:     Rate and Rhythm: Normal rate.  Pulmonary:     Effort: Pulmonary effort is normal.  Musculoskeletal:     Left foot: Laceration present.       Feet:     Comments: Lateral edge of left foot has a 2.5cm long simple linear laceration as noted on diagram.   Skin:    General: Skin is warm and dry.  Neurological:     Mental Status: He is alert.      UC Treatments / Results  Labs (all labs ordered are listed, but only abnormal results are displayed) Labs Reviewed - No data to display  EKG   Radiology No results  found.  Procedures Procedures  Laceration Repair Discussed benefits and risks of procedure and verbal consent obtained. Using sterile technique and local anesthesia with 1% lidocaine with epinephrine, cleansed wound with Betadine followed by copious lavage with normal saline.  Wound carefully inspected for debris and foreign bodies; none found.  Wound closed with #6, 4-0 interrupted nylon sutures.  Bacitracin and non-stick sterile dressing applied.  Wound precautions explained to patient.  Return for suture removal in 10 days.   Medications Ordered in UC Medications - No data to display  Initial Impression / Assessment and Plan / UC Course  I have reviewed the triage vital signs and the nursing notes.  Pertinent labs & imaging results that were available during my care of the patient were reviewed by me and considered in my medical decision making (see chart for details).       Final Clinical Impressions(s) / UC Diagnoses   Final diagnoses:  Laceration of left foot, initial encounter     Discharge Instructions     Change dressing daily and apply Bacitracin ointment to wound.  Keep wound clean and dry.  Return for any signs of infection (or follow-up with family doctor):  Increasing redness, swelling, pain, heat, drainage, etc. Return in 10 days for suture removal.     ED Prescriptions    None        Kandra Nicolas, MD 06/02/19 1208

## 2019-06-01 NOTE — ED Triage Notes (Signed)
Last tetanus 10/07/2015

## 2019-06-01 NOTE — ED Triage Notes (Signed)
Foot laceration today on a piece of tin at 6pm

## 2019-06-01 NOTE — Discharge Instructions (Addendum)
Change dressing daily and apply Bacitracin ointment to wound.  Keep wound clean and dry.  Return for any signs of infection (or follow-up with family doctor):  Increasing redness, swelling, pain, heat, drainage, etc. °Return in 10 days for suture removal.   °

## 2019-06-12 ENCOUNTER — Other Ambulatory Visit: Payer: Self-pay

## 2019-06-12 ENCOUNTER — Emergency Department (INDEPENDENT_AMBULATORY_CARE_PROVIDER_SITE_OTHER)
Admission: EM | Admit: 2019-06-12 | Discharge: 2019-06-12 | Disposition: A | Discharge status: Home / Self Care | Payer: Medicare Other | Source: Home / Self Care

## 2019-06-12 DIAGNOSIS — Z4802 Encounter for removal of sutures: Secondary | ICD-10-CM

## 2019-06-12 NOTE — ED Triage Notes (Signed)
Patient here for removal of 6 sutures from the outer aspect of his left foot. 6 sutures removed, wound healing well. Clean and dry, re-bandaged prior to discharge.

## 2019-06-15 ENCOUNTER — Other Ambulatory Visit: Payer: Self-pay

## 2019-06-15 ENCOUNTER — Telehealth: Payer: Self-pay

## 2019-06-15 ENCOUNTER — Ambulatory Visit (INDEPENDENT_AMBULATORY_CARE_PROVIDER_SITE_OTHER): Payer: Medicare Other | Admitting: Internal Medicine

## 2019-06-15 ENCOUNTER — Encounter: Payer: Self-pay | Admitting: Internal Medicine

## 2019-06-15 VITALS — BP 175/100 | HR 79 | Temp 98.0°F | Resp 18 | Ht 74.0 in | Wt 201.4 lb

## 2019-06-15 DIAGNOSIS — J302 Other seasonal allergic rhinitis: Secondary | ICD-10-CM

## 2019-06-15 DIAGNOSIS — R030 Elevated blood-pressure reading, without diagnosis of hypertension: Secondary | ICD-10-CM

## 2019-06-15 MED ORDER — AZELASTINE HCL 0.1 % NA SOLN: 2.0000 | Freq: Two times a day (BID) | NASAL | 6 refills | Status: DC

## 2019-06-15 MED ORDER — PREDNISONE 10 MG PO TABS
ORAL_TABLET | ORAL | 0 refills | Status: DC
Start: 2019-06-15 — End: 2020-11-04

## 2019-06-15 NOTE — Progress Notes (Signed)
   Subjective:    Patient ID: James [name], male    DOB: Dec 29, 1950, 69 y.o.   MRN: [id number]  DOS:  06/15/2019 Type of visit - description: Acute Symptoms started 4 to 5 days ago: Cough, runny nose, sinus congestion, postnasal dripping. He thinks is allergies, states has same sxs every year when the pollen is high.   BP Readings from Last 3 Encounters:  06/15/19 (!) 175/100  06/01/19 (!) 186/97  10/18/18 (!) 146/88     Review of Systems Denies fever chills + Sneezing No itchy eyes or nose. No chest pain no difficulty breathing No myalgias  Past Medical History:  Diagnosis Date  . No known problems     Past Surgical History:  Procedure Laterality Date  . COLONOSCOPY  2006   Neg, Dr.Gessner  . VASECTOMY      Allergies as of 06/15/2019   No Known Allergies     Medication List       Accurate as of Jun 15, 2019 11:33 AM. If you have any questions, ask your nurse or doctor.        Krill Oil 1000 MG Caps Take by mouth.   OSTEO BI-FLEX ADV JOINT SHIELD PO Take 1 capsule by mouth daily.          Objective:   Physical Exam Resp 18   Ht 6\' 2"  (1.88 m)   Wt 201 lb 6 oz (91.3 kg)   BMI 25.86 kg/m  General:   Well developed, NAD, BMI noted. HEENT:  Normocephalic . Face symmetric, atraumatic. TMs normal, nose is slightly congested, throat symmetric and not red. Lungs:  CTA B Normal respiratory effort, no intercostal retractions, no accessory muscle use. Heart: RRR,  no murmur.  Lower extremities: no pretibial edema bilaterally  Skin: Not pale. Not jaundice Neurologic:  alert & oriented X3.  Speech normal, gait appropriate for age and unassisted Psych--  Cognition and judgment appear intact.  Cooperative with normal attention span and concentration.  Behavior appropriate. No anxious or depressed appearing.      Assessment    Seasonal allergies: Symptoms consistent with allergies, doubt a bacterial or viral infection. Continue with Nasacort,  Claritin. Change Robitussin to Robitussin-DM or Mucinex DM Add Astelin, Rx sent Short burst of prednisone Call if not gradually better Call if fever, chills or other symptoms, at the end this could be early infection and he might need antibiotics. Elevated BP: Reports that BP is checked by his wife who is a Marine scientist and it is consistently within normal at home.    This visit occurred during the SARS-CoV-2 public health emergency.  Safety protocols were in place, including screening questions prior to the visit, additional usage of staff PPE, and extensive cleaning of exam room while observing appropriate contact time as indicated for disinfecting solutions.

## 2019-06-15 NOTE — Progress Notes (Signed)
Pre visit review using our clinic review tool, if applicable. No additional management support is needed unless otherwise documented below in the visit note. 

## 2019-06-15 NOTE — Patient Instructions (Signed)
Continue Nasacort 2 sprays on each side of the nose daily  Continue Claritin 10 mg OTC: 1 tablet daily  Start Mucinex DM or Robitussin-DM for cough suppression  I sent a prescription for Astelin, and other nose spray: Use it twice a day  I sent a prescription for prednisone, will help quickly with your allergy symptoms.  If you are not gradually better please let me know  If you have fever, chills, brown sputum production please call anytime

## 2020-05-30 ENCOUNTER — Encounter: Payer: Self-pay | Admitting: Gastroenterology

## 2020-10-13 IMAGING — DX DG HIP (WITH OR WITHOUT PELVIS) 3-4V BILAT
5 series · 5 of 5 positions shown · non-contrast
Comparison: None.

CLINICAL DATA: 67-year-old male with a history of pain buttocks for
6 weeks

EXAM:
DG HIP (WITH OR WITHOUT PELVIS) 3-4V BILAT

[pelvis ap]
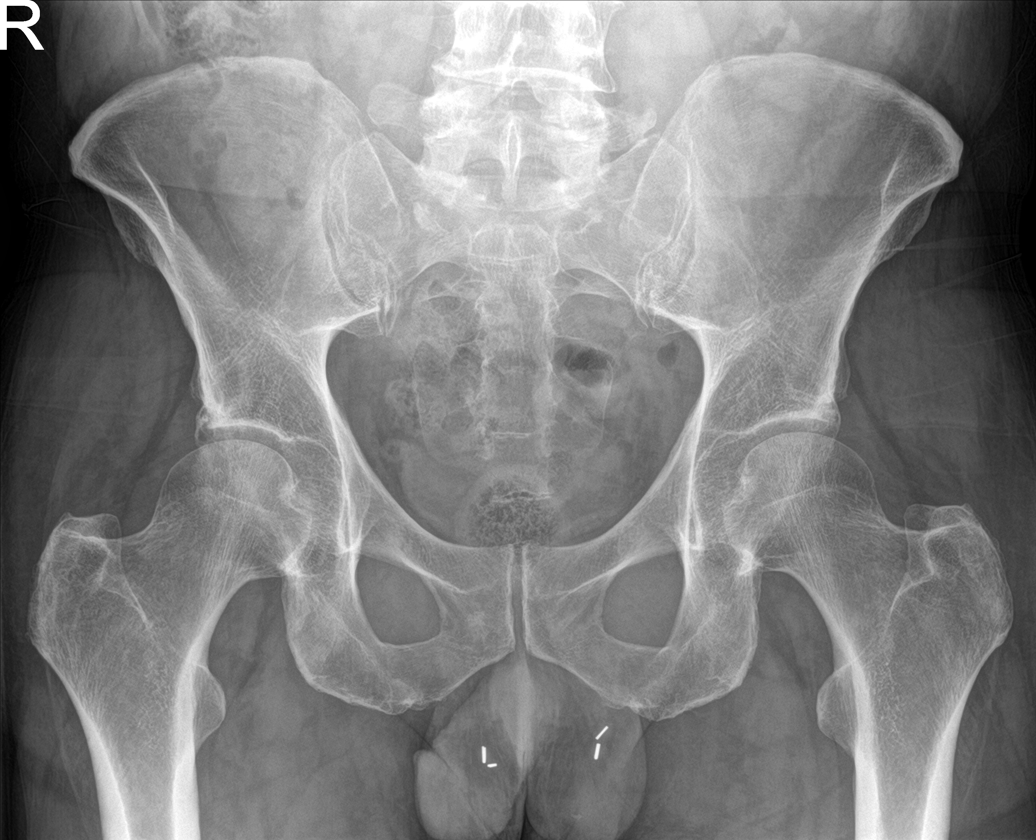

[hip ap (1 of 2)]
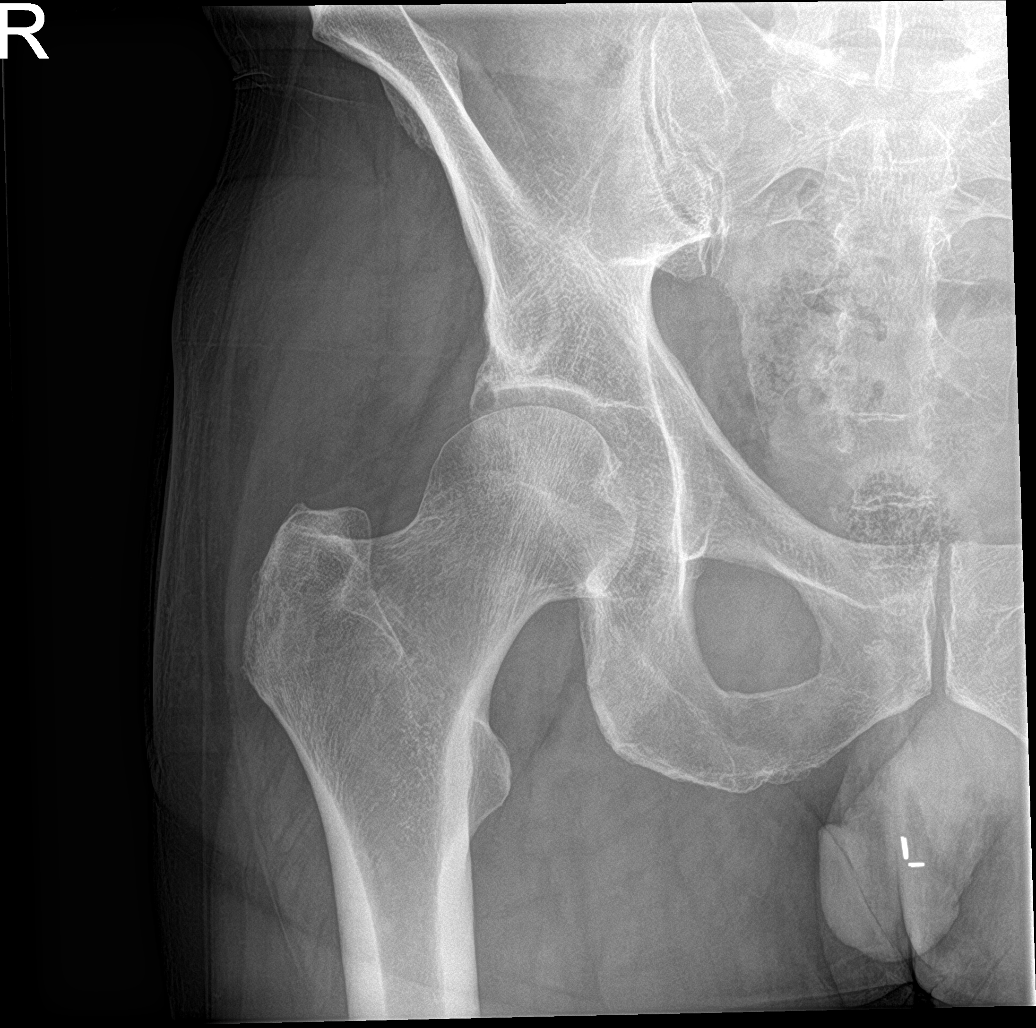

[hip lat (1 of 2)]
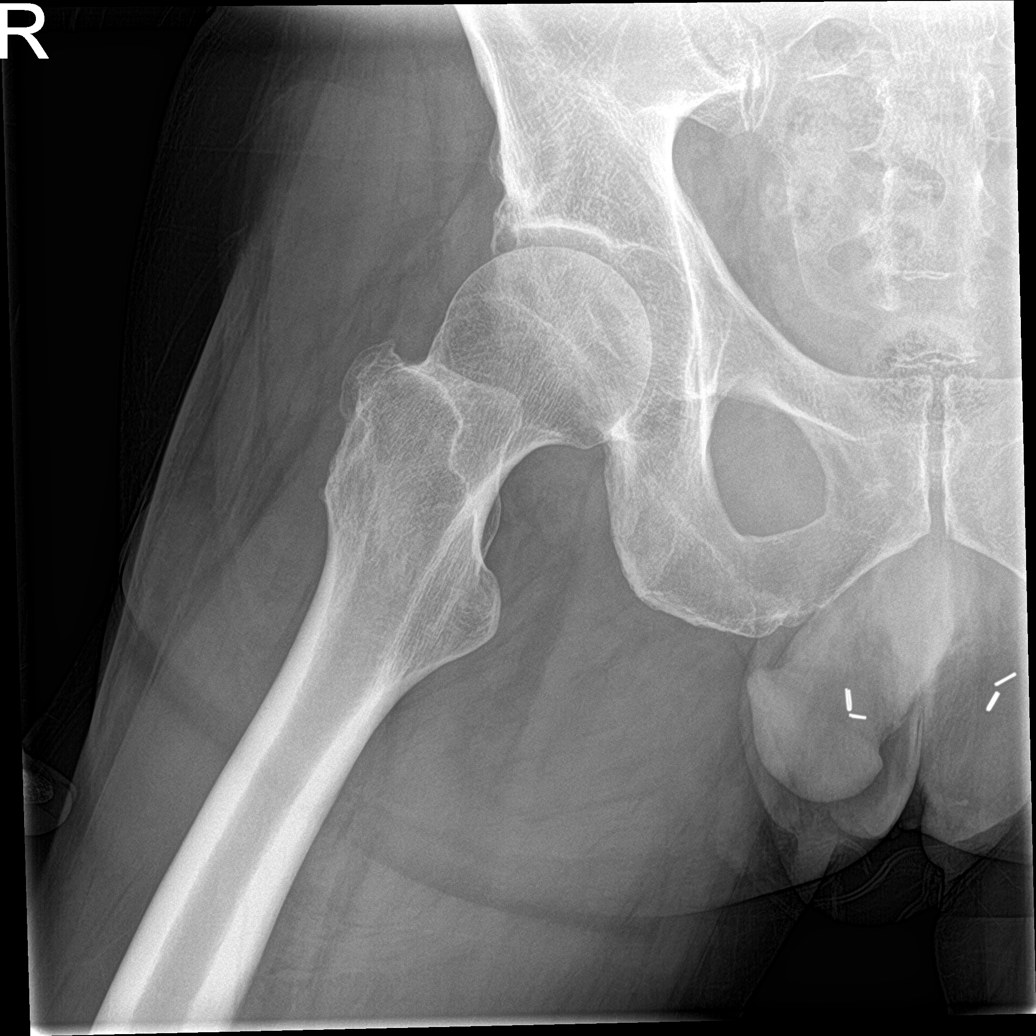

[hip ap (2 of 2)]
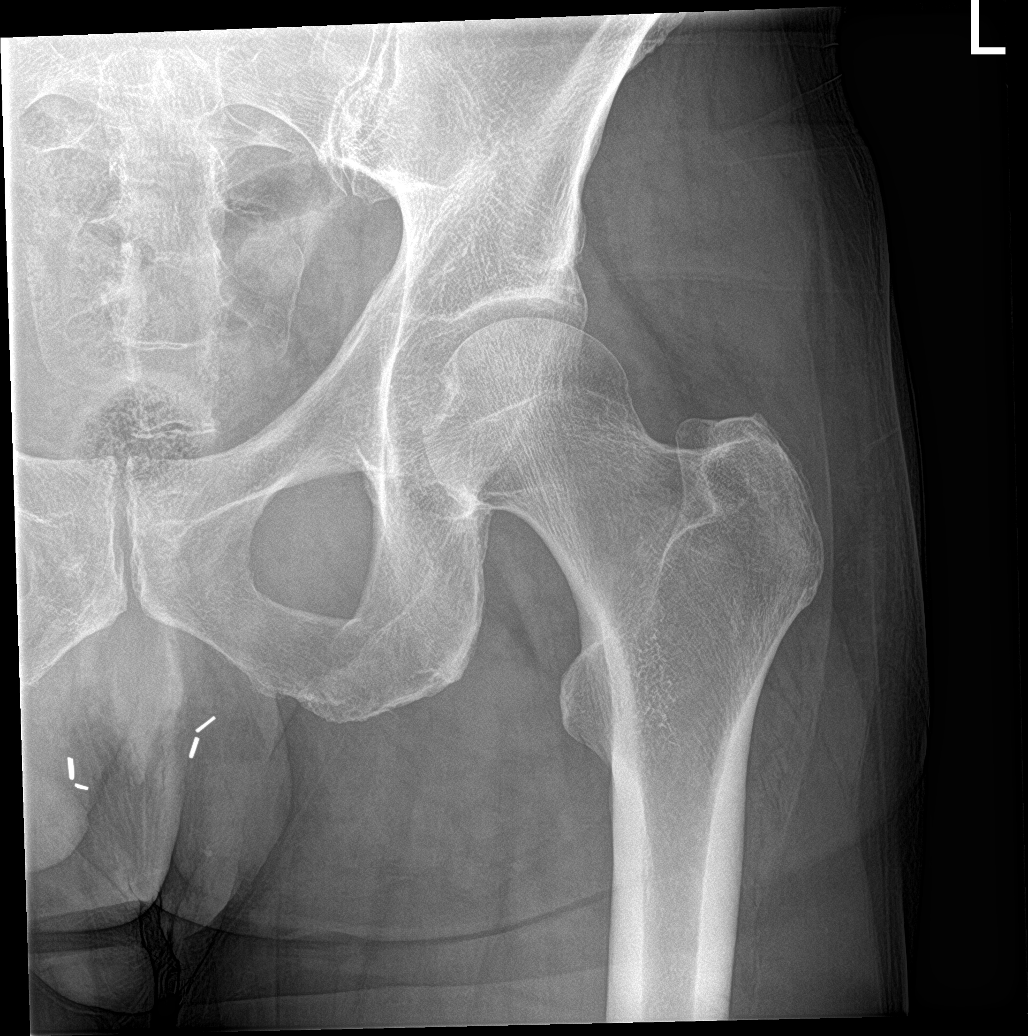

[hip lat (2 of 2)]
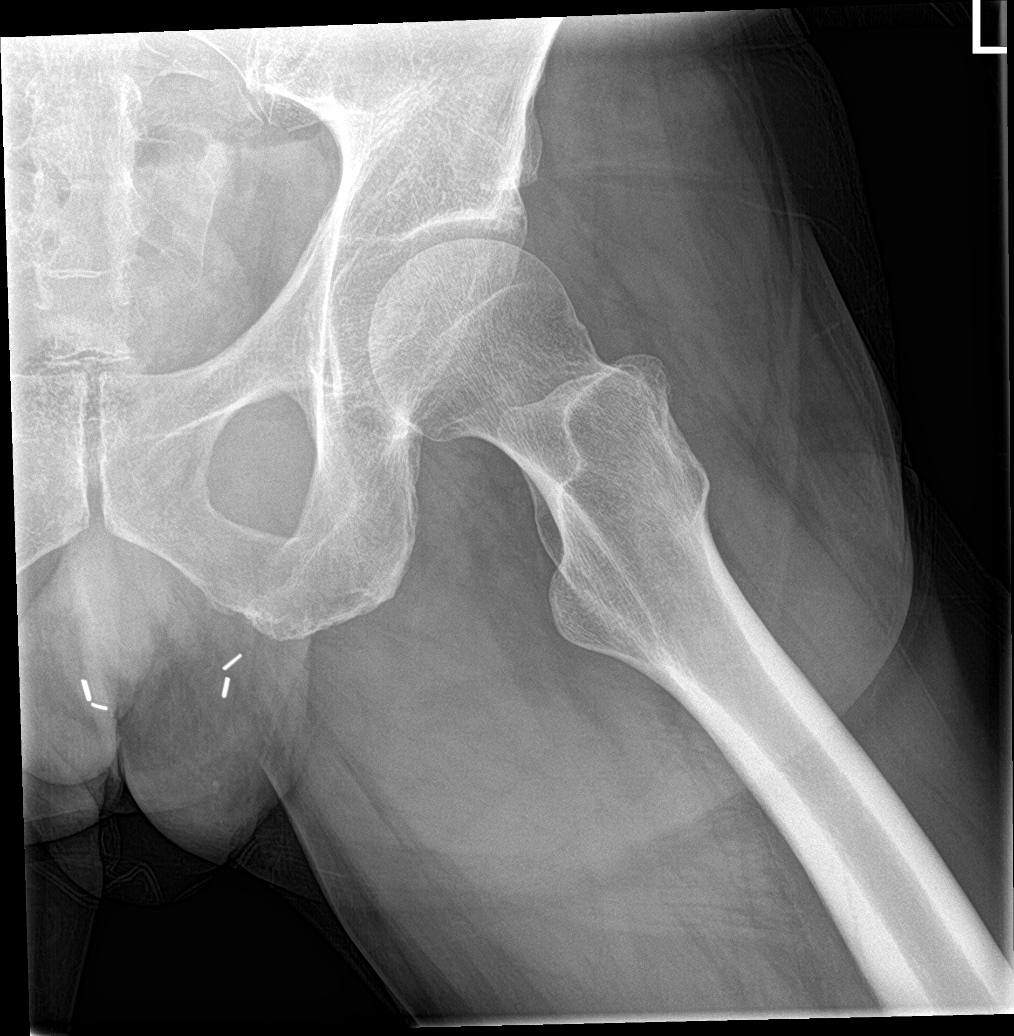

[5 of 5 positions shown; findings below may reference images not displayed]

FINDINGS: Bony pelvic ring intact with no acute displaced fracture. Bilateral
hips projects normally over the acetabula. Minimal degenerative
changes of the bilateral hips. No acute displaced fracture.

Surgical changes of vasectomy.
IMPRESSION: Negative for acute bony abnormality.

Mild bilateral hip degenerative changes

## 2020-11-04 ENCOUNTER — Other Ambulatory Visit: Payer: Self-pay

## 2020-11-04 ENCOUNTER — Ambulatory Visit (INDEPENDENT_AMBULATORY_CARE_PROVIDER_SITE_OTHER): Payer: Medicare Other | Admitting: Internal Medicine

## 2020-11-04 ENCOUNTER — Encounter: Payer: Self-pay | Admitting: Internal Medicine

## 2020-11-04 VITALS — BP 162/82 | HR 69 | Temp 98.0°F | Resp 16 | Ht 74.0 in | Wt 210.5 lb

## 2020-11-04 DIAGNOSIS — R030 Elevated blood-pressure reading, without diagnosis of hypertension: Secondary | ICD-10-CM | POA: Diagnosis not present

## 2020-11-04 DIAGNOSIS — M722 Plantar fascial fibromatosis: Secondary | ICD-10-CM | POA: Diagnosis not present

## 2020-11-04 NOTE — Progress Notes (Signed)
   Subjective:    Patient ID: James [name], male    DOB: 1950/08/06, 70 y.o.   MRN: [id number]  DOS:  11/04/2020 Type of visit - description: Acute  4-week history of left heel pain. The pain is sharp stabbing when he put pressure or walks. No pain at rest. He got new comfortable shoes but that has not helped.  Also, BP noted to be elevated again. He denies headache, nausea, vomiting. No chest pain no difficulty breathing  Review of Systems See above   Past Medical History:  Diagnosis Date   No known problems     Past Surgical History:  Procedure Laterality Date   COLONOSCOPY  2006   Neg, Dr.Gessner   VASECTOMY      Allergies as of 11/04/2020   No Known Allergies      Medication List        Accurate as of November 04, 2020  8:43 AM. If you have any questions, ask your nurse or doctor.          azelastine 0.1 % nasal spray Commonly known as: ASTELIN Place 2 sprays into both nostrils 2 (two) times daily.   ibuprofen 200 MG tablet Commonly known as: ADVIL Take 200 mg by mouth every 6 (six) hours as needed for moderate pain.   Krill Oil 1000 MG Caps Take by mouth.   OSTEO BI-FLEX ADV JOINT SHIELD PO Take 1 capsule by mouth daily.   predniSONE 10 MG tablet Commonly known as: DELTASONE 3 tabs x 3 days, 2 tabs x 3 days, 1 tab x 3 days           Objective:   Physical Exam BP (!) 162/82 (BP Location: Left Arm, Patient Position: Sitting, Cuff Size: Normal)   Pulse 69   Temp 98 F (36.7 C) (Oral)   Resp 16   Ht 6\' 2"  (1.88 m)   Wt 210 lb 8 oz (95.5 kg)   SpO2 96%   BMI 27.03 kg/m  General:   Well developed, NAD, BMI noted. HEENT:  Normocephalic . Face symmetric, atraumatic Lungs:  CTA B Normal respiratory effort, no intercostal retractions, no accessory muscle use. Heart: RRR,  no murmur.  L foot: Normal to inspection and palpation except for very mild TTP at the plantar aspect of the heel Skin: Not pale. Not jaundice Neurologic:  alert &  oriented X3.  Speech normal, gait appropriate for age and unassisted Psych--  Cognition and judgment appear intact.  Cooperative with normal attention span and concentration.  Behavior appropriate. No anxious or depressed appearing.      Assessment   70 year old patient, PMH includes allergies, rosacea, BPH, elevated BP presents with:  Planter fasciitis: SX as described above, recommend stretching, icing, okay ibuprofen but at a much lower dose (currently taking 600 mg 3-4 times a day).  Call if not gradually better Elevated BP: BP is elevated again today, he reports he is active and eats low salt. Recommend to schedule CPX within a month (likes to transfer to my care, I accepted as my patient).    This visit occurred during the SARS-CoV-2 public health emergency.  Safety protocols were in place, including screening questions prior to the visit, additional usage of staff PPE, and extensive cleaning of exam room while observing appropriate contact time as indicated for disinfecting solutions.

## 2020-11-04 NOTE — Patient Instructions (Signed)
Planter fasciitis: -Stretching 3-4 times a day -Icing -Try to cut down on ibuprofen  Your blood pressure is elevated today.   Check the  blood pressure twice a week BP GOAL is between 110/65 and  135/85. If it is consistently higher or lower, let me know    GO TO THE FRONT DESK, PLEASE SCHEDULE YOUR APPOINTMENTS Schedule a complete physical exam with me within a month.     Plantar Fasciitis Plantar fasciitis is a painful foot condition that affects the heel. It occurs when the band of tissue that connects the toes to the heel bone (plantar fascia) becomes irritated. This can happen as the result of exercising too much or doing other repetitive activities (overuse injury). Plantar fasciitis can cause mild irritation to severe pain that makes it difficult to walk or move. The pain is usually worse in the morning after sleeping, or after sitting or lying down for a period of time. Pain may also be worse after long periods of walking or standing. What are the causes? This condition may be caused by: Standing for long periods of time. Wearing shoes that do not have good arch support. Doing activities that put stress on joints (high-impact activities). This includes ballet and exercise that makes your heart beat faster (aerobic exercise), such as running. Being overweight. An abnormal way of walking (gait). Tight muscles in the back of your lower leg (calf). High arches in your feet or flat feet. Starting a new athletic activity. What are the signs or symptoms? The main symptom of this condition is heel pain. Pain may get worse after the following: Taking the first steps after a time of rest, especially in the morning after awakening, or after you have been sitting or lying down for a while. Long periods of standing still. Pain may decrease after 30-45 minutes of activity, such as gentle walking. How is this diagnosed? This condition may be diagnosed based on your medical history, a  physical exam, and your symptoms. Your health care provider will check for: A tender area on the bottom of your foot. A high arch in your foot or flat feet. Pain when you move your foot. Difficulty moving your foot. You may have imaging tests to confirm the diagnosis, such as: X-rays. Ultrasound. MRI. How is this treated? Treatment for plantar fasciitis depends on how severe your condition is. Treatment may include: Rest, ice, pressure (compression), and raising (elevating) the affected foot. This is called RICE therapy. Your health care provider may recommend RICE therapy along with over-the-counter pain medicines to manage your pain. Exercises to stretch your calves and your plantar fascia. A splint that holds your foot in a stretched, upward position while you sleep (night splint). Physical therapy to relieve symptoms and prevent problems in the future. Injections of steroid medicine (cortisone) to relieve pain and inflammation. Stimulating your plantar fascia with electrical impulses (extracorporeal shock wave therapy). This is usually the last treatment option before surgery. Surgery, if other treatments have not worked after 12 months. Follow these instructions at home: Managing pain, stiffness, and swelling  If directed, put ice on the painful area. To do this: Put ice in a plastic bag, or use a frozen bottle of water. Place a towel between your skin and the bag or bottle. Roll the bottom of your foot over the bag or bottle. Do this for 20 minutes, 2-3 times a day. Wear athletic shoes that have air-sole or gel-sole cushions, or try soft shoe inserts that are designed  for plantar fasciitis. Elevate your foot above the level of your heart while you are sitting or lying down. Activity Avoid activities that cause pain. Ask your health care provider what activities are safe for you. Do physical therapy exercises and stretches as told by your health care provider. Try activities and  forms of exercise that are easier on your joints (low impact). Examples include swimming, water aerobics, and biking. General instructions Take over-the-counter and prescription medicines only as told by your health care provider. Wear a night splint while sleeping, if told by your health care provider. Loosen the splint if your toes tingle, become numb, or turn cold and blue. Maintain a healthy weight, or work with your health care provider to lose weight as needed. Keep all follow-up visits. This is important. Contact a health care provider if you have: Symptoms that do not go away with home treatment. Pain that gets worse. Pain that affects your ability to move or do daily activities. Summary Plantar fasciitis is a painful foot condition that affects the heel. It occurs when the band of tissue that connects the toes to the heel bone (plantar fascia) becomes irritated. Heel pain is the main symptom of this condition. It may get worse after exercising too much or standing still for a long time. Treatment varies, but it usually starts with rest, ice, pressure (compression), and raising (elevating) the affected foot. This is called RICE therapy. Over-the-counter medicines can also be used to manage pain. This information is not intended to replace advice given to you by your health care provider. Make sure you discuss any questions you have with your health care provider. Document Revised: 05/08/2019 Document Reviewed: 05/08/2019 Elsevier Patient Education  2022 [employer].

## 2020-11-24 ENCOUNTER — Encounter: Payer: Self-pay | Admitting: Internal Medicine

## 2020-11-24 DIAGNOSIS — M722 Plantar fascial fibromatosis: Secondary | ICD-10-CM

## 2020-11-26 ENCOUNTER — Ambulatory Visit (INDEPENDENT_AMBULATORY_CARE_PROVIDER_SITE_OTHER): Payer: Medicare Other | Admitting: Family Medicine

## 2020-11-26 ENCOUNTER — Encounter: Payer: Self-pay | Admitting: Family Medicine

## 2020-11-26 ENCOUNTER — Ambulatory Visit: Payer: Self-pay

## 2020-11-26 VITALS — BP 160/90 | Ht 74.0 in | Wt 210.0 lb

## 2020-11-26 DIAGNOSIS — M79672 Pain in left foot: Secondary | ICD-10-CM

## 2020-11-26 DIAGNOSIS — M722 Plantar fascial fibromatosis: Secondary | ICD-10-CM | POA: Diagnosis not present

## 2020-11-26 MED ORDER — METHYLPREDNISOLONE ACETATE 40 MG/ML IJ SUSP
40.0000 mg | Freq: Once | INTRAMUSCULAR | Status: AC
  Administered 2020-11-26: 40 mg

## 2020-11-26 NOTE — Progress Notes (Signed)
James [name] - 70 y.o. male MRN [id number]  Date of birth: Jun 14, 1950  SUBJECTIVE:  Including CC & ROS.  No chief complaint on file.   James [name] is a 70 y.o. male that is presenting with acute on chronic left heel pain.  The pain is worse with the first few steps.  Has tried heel cups and ibuprofen.   Review of Systems See HPI   HISTORY: Past Medical, Surgical, Social, and Family History Reviewed & Updated per EMR.   Pertinent Historical Findings include:  Past Medical History:  Diagnosis Date   No known problems     Past Surgical History:  Procedure Laterality Date   COLONOSCOPY  2006   Neg, Dr.Gessner   VASECTOMY      Family History  Problem Relation Age of Onset   Stroke Mother 5   Heart attack Father 38   Cancer Neg Hx    Diabetes Neg Hx    Colon cancer Neg Hx    Colon polyps Neg Hx     Social History   Socioeconomic History   Marital status: Married    Spouse name: Not on file   Number of children: Not on file   Years of education: Not on file   Highest education level: Not on file  Occupational History   Not on file  Tobacco Use   Smoking status: Never   Smokeless tobacco: Never  Vaping Use   Vaping Use: Never used  Substance and Sexual Activity   Alcohol use: Yes    Alcohol/week: 0.0 standard drinks    Comment: 3 Beer/week   Drug use: No   Sexual activity: Not on file  Other Topics Concern   Not on file  Social History Narrative   Not on file   Social Determinants of Health   Financial Resource Strain: Not on file  Food Insecurity: Not on file  Transportation Needs: Not on file  Physical Activity: Not on file  Stress: Not on file  Social Connections: Not on file  Intimate Partner Violence: Not on file     PHYSICAL EXAM:  VS: BP (!) 160/90 (BP Location: Left Arm, Patient Position: Sitting)   Ht 6\' 2"  (1.88 m)   Wt 210 lb (95.3 kg)   BMI 26.96 kg/m  Physical Exam Gen: NAD, alert, cooperative with exam, well-appearing    Limited ultrasound: Left foot:  Normal-appearing Achilles. Mild spurring at the insertion of the Achilles tendon. Thickening of the plantar fascia that measures 0.86 with a nodule appreciated in the mid plantar fascia  Summary: Findings indicate Planter fasciitis  Ultrasound and interpretation by Clearance Coots, MD   Aspiration/Injection Procedure Note DAN SCEARCE [birth year]  Procedure: Injection Indications: Left foot pain  Procedure Details Consent: Risks of procedure as well as the alternatives and risks of each were explained to the (patient/caregiver).  Consent for procedure obtained. Time Out: Verified patient identification, verified procedure, site/side was marked, verified correct patient position, special equipment/implants available, medications/allergies/relevent history reviewed, required imaging and test results available.  Performed.  The area was cleaned with iodine and alcohol swabs.    The left plantar fashion was injected using 1 cc's of 40 mg Depo-Medrol and 2 cc's of 0.25 % bupivacaine with a 25 1 1/2" needle.  Ultrasound was used. Images were obtained in short views showing the injection.     A sterile dressing was applied.  Patient did tolerate procedure well.     ASSESSMENT & PLAN:   Plantar  fasciitis, left Has thickening of the plantar fascia -Counseled on home exercise therapy and supportive care. -Green sport insoles with loading of the heel. -Injection today. -Could consider physical therapy or PRP injection.

## 2020-11-26 NOTE — Patient Instructions (Signed)
Nice to meet you Please try the exercises  Please try ice   Please send me a message in MyChart with any questions or updates.  Please see me back in 4 weeks.   --Dr. Raeford Razor

## 2020-11-26 NOTE — Assessment & Plan Note (Signed)
Has thickening of the plantar fascia -Counseled on home exercise therapy and supportive care. -Green sport insoles with loading of the heel. -Injection today. -Could consider physical therapy or PRP injection.

## 2020-12-24 ENCOUNTER — Other Ambulatory Visit: Payer: Self-pay

## 2020-12-24 ENCOUNTER — Encounter: Payer: Self-pay | Admitting: Internal Medicine

## 2020-12-24 ENCOUNTER — Ambulatory Visit (INDEPENDENT_AMBULATORY_CARE_PROVIDER_SITE_OTHER): Payer: Medicare Other | Admitting: Internal Medicine

## 2020-12-24 VITALS — BP 138/86 | HR 79 | Temp 97.9°F | Resp 16 | Ht 74.0 in | Wt 209.0 lb

## 2020-12-24 DIAGNOSIS — Z0000 Encounter for general adult medical examination without abnormal findings: Secondary | ICD-10-CM | POA: Diagnosis not present

## 2020-12-24 DIAGNOSIS — M722 Plantar fascial fibromatosis: Secondary | ICD-10-CM | POA: Diagnosis not present

## 2020-12-24 DIAGNOSIS — E785 Hyperlipidemia, unspecified: Secondary | ICD-10-CM | POA: Diagnosis not present

## 2020-12-24 DIAGNOSIS — Z09 Encounter for follow-up examination after completed treatment for conditions other than malignant neoplasm: Secondary | ICD-10-CM | POA: Insufficient documentation

## 2020-12-24 DIAGNOSIS — R399 Unspecified symptoms and signs involving the genitourinary system: Secondary | ICD-10-CM

## 2020-12-24 LAB — COMPREHENSIVE METABOLIC PANEL
ALT: 13 U/L (ref 0–53)
AST: 17 U/L (ref 0–37)
Albumin: 4.6 g/dL (ref 3.5–5.2)
Alkaline Phosphatase: 51 U/L (ref 39–117)
BUN: 14 mg/dL (ref 6–23)
CO2: 28 mEq/L (ref 19–32)
Calcium: 9.5 mg/dL (ref 8.4–10.5)
Chloride: 103 mEq/L (ref 96–112)
Creatinine, Ser: 1.09 mg/dL (ref 0.40–1.50)
GFR: 70.02 mL/min (ref >60.00)
Glucose, Bld: 68 mg/dL — ABNORMAL LOW (ref 70–99)
Potassium: 4.9 mEq/L (ref 3.5–5.1)
Sodium: 139 mEq/L (ref 135–145)
Total Bilirubin: 1.2 mg/dL (ref 0.2–1.2)
Total Protein: 6.9 g/dL (ref 6.0–8.3)

## 2020-12-24 LAB — URINALYSIS, ROUTINE W REFLEX MICROSCOPIC
Bilirubin Urine: NEGATIVE
Hgb urine dipstick: NEGATIVE
Ketones, ur: NEGATIVE
Leukocytes,Ua: NEGATIVE
Nitrite: NEGATIVE
RBC / HPF: NONE SEEN (ref 0–?)
Specific Gravity, Urine: 1.025 (ref 1.000–1.030)
Total Protein, Urine: NEGATIVE
Urine Glucose: NEGATIVE
Urobilinogen, UA: 0.2 (ref 0.0–1.0)
WBC, UA: NONE SEEN (ref 0–?)
pH: 5.5 (ref 5.0–8.0)

## 2020-12-24 LAB — CBC WITH DIFFERENTIAL/PLATELET
Basophils Absolute: 0.1 10*3/uL (ref 0.0–0.1)
Basophils Relative: 1.2 % (ref 0.0–3.0)
Eosinophils Absolute: 0.1 10*3/uL (ref 0.0–0.7)
Eosinophils Relative: 3.4 % (ref 0.0–5.0)
HCT: 38.5 % — ABNORMAL LOW (ref 39.0–52.0)
Hemoglobin: 13.4 g/dL (ref 13.0–17.0)
Lymphocytes Relative: 28.5 % (ref 12.0–46.0)
Lymphs Abs: 1.2 10*3/uL (ref 0.7–4.0)
MCHC: 34.7 g/dL (ref 30.0–36.0)
MCV: 84.2 fl (ref 78.0–100.0)
Monocytes Absolute: 0.3 10*3/uL (ref 0.1–1.0)
Monocytes Relative: 8 % (ref 3.0–12.0)
Neutro Abs: 2.6 10*3/uL (ref 1.4–7.7)
Neutrophils Relative %: 58.9 % (ref 43.0–77.0)
Platelets: 200 10*3/uL (ref 150.0–400.0)
RBC: 4.57 Mil/uL (ref 4.22–5.81)
RDW: 13.9 % (ref 11.5–15.5)
WBC: 4.4 10*3/uL (ref 4.0–10.5)

## 2020-12-24 LAB — LIPID PANEL
Cholesterol: 138 mg/dL (ref 0–200)
HDL: 52.3 mg/dL (ref >39.00)
LDL Cholesterol: 77 mg/dL (ref 0–99)
NonHDL: 85.63
Total CHOL/HDL Ratio: 3
Triglycerides: 42 mg/dL (ref 0.0–149.0)
VLDL: 8.4 mg/dL (ref 0.0–40.0)

## 2020-12-24 LAB — TSH: TSH: 1.71 u[IU]/mL (ref 0.35–5.50)

## 2020-12-24 LAB — PSA: PSA: 3.18 ng/mL (ref 0.10–4.00)

## 2020-12-24 LAB — HEMOGLOBIN A1C: Hgb A1c MFr Bld: 4.4 % — ABNORMAL LOW (ref 4.6–6.5)

## 2020-12-24 MED ORDER — PREDNISONE 10 MG PO TABS: ORAL_TABLET | ORAL | 0 refills | Status: DC

## 2020-12-24 NOTE — Progress Notes (Signed)
Subjective:    Patient ID: James [name], male    DOB: [birth year], 70 y.o.   MRN: [id number]  DOS:  12/24/2020 Type of visit - description: CPX  Other than ongoing problems with left planter fasciitis he is doing well.  Unable to exercise much Also reports nocturia, this is going on for a while, wakes up 3 to 4 hours a night, no dysuria/gross hematuria/difficulty urinating.  Review of Systems  Other than above, a 14 point review of systems is negative      Past Medical History:  Diagnosis Date   No known problems     Past Surgical History:  Procedure Laterality Date   COLONOSCOPY  2006   Neg, Dr.Gessner   VASECTOMY     Social History   Socioeconomic History   Marital status: Married    Spouse name: Not on file   Number of children: 2   Years of education: Not on file   Highest education level: Not on file  Occupational History   Occupation: [employer] , retired, TEFL teacher    Comment: works part time  Tobacco Use   Smoking status: Never   Smokeless tobacco: Never  Vaping Use   Vaping Use: Never used  Substance and Sexual Activity   Alcohol use: Yes    Alcohol/week: 0.0 standard drinks    Comment: 3 Beer/week   Drug use: No   Sexual activity: Not on file  Other Topics Concern   Not on file  Social History Narrative   Household= pt and wife   Social Determinants of Health   Financial Resource Strain: Not on file  Food Insecurity: Not on file  Transportation Needs: Not on file  Physical Activity: Not on file  Stress: Not on file  Social Connections: Not on file  Intimate Partner Violence: Not on file    Allergies as of 12/24/2020   No Known Allergies      Medication List        Accurate as of [name] 22, 2022  5:25 PM. If you have any questions, ask your nurse or doctor.          STOP taking these medications    azelastine 0.1 % nasal spray Commonly known as: ASTELIN Stopped by: James November, MD   ibuprofen 200 MG tablet Commonly known  as: ADVIL Stopped by: James November, MD       TAKE these medications    predniSONE 10 MG tablet Commonly known as: DELTASONE 4 tablets x 2 days, 3 tabs x 2 days, 2 tabs x 2 days, 1 tab x 2 days Started by: James November, MD           Objective:   Physical Exam BP 138/86 (BP Location: Left Arm, Patient Position: Sitting, Cuff Size: Normal)   Pulse 79   Temp 97.9 F (36.6 C) (Oral)   Resp 16   Ht 6\' 2"  (1.88 m)   Wt 209 lb (94.8 kg)   SpO2 97%   BMI 26.83 kg/m  General: Well developed, NAD, BMI noted Neck: No  thyromegaly  HEENT:  Normocephalic . Face symmetric, atraumatic Lungs:  CTA B Normal respiratory effort, no intercostal retractions, no accessory muscle use. Heart: RRR,  no murmur.  Abdomen:  Not distended, soft, non-tender. No rebound or rigidity. DRE: Symmetrical increased prostate with no nodules.  Not tender. No stools found, sphincter normal tone. Lower extremities: no pretibial edema bilaterally.  High arch noted Skin: Exposed areas without rash. Not pale.  Not jaundice Neurologic:  alert & oriented X3.  Speech normal, gait appropriate for age and unassisted Strength symmetric and appropriate for age.  Psych: Cognition and judgment appear intact.  Cooperative with normal attention span and concentration.  Behavior appropriate. No anxious or depressed appearing.     Assessment    Assessment Allergies Rosacea Rosanna Randy syndrome FH CAD father age 80  PLAN Here for CPX L Planter fasciitis: Ongoing, saw sports medicine, multiple modalities of stretching, a local injection and  ice are not helping. Patient wonders about second opinion. Plan: Stick to only 1 type ofr stretching, round of prednisone, get a arch support shoe insert, if not better call for a referral (ortho-foot). Dyslipidemia: CV RF 13.1%, qualify for statins, he has a FH CAD, patient aware.  Wait for results. RTC 1 year.   This visit occurred during the SARS-CoV-2 public health emergency.   Safety protocols were in place, including screening questions prior to the visit, additional usage of staff PPE, and extensive cleaning of exam room while observing appropriate contact time as indicated for disinfecting solutions.

## 2020-12-24 NOTE — Assessment & Plan Note (Signed)
Assessment Allergies Rosacea Rosanna Randy syndrome FH CAD father age 68  PLAN Here for CPX L Planter fasciitis: Ongoing, saw sports medicine, multiple modalities of stretching, a local injection and  ice are not helping. Patient wonders about second opinion. Plan: Stick to only 1 type ofr stretching, round of prednisone, get a arch support shoe insert, if not better call for a referral (ortho-foot). Dyslipidemia: CV RF 13.1%, qualify for statins, he has a FH CAD, patient aware.  Wait for results. RTC 1 year.

## 2020-12-24 NOTE — Patient Instructions (Addendum)
Planter fasciitis: 1 type  of stretching twice daily. Prednisone Arch support shoe insert Call in 2 to 3 weeks if no better    GO TO THE LAB : Get the blood work     [place], [place] Come back for physical exam in 1 year.

## 2020-12-24 NOTE — Assessment & Plan Note (Signed)
Tdap 2017 Shingrix recommended  PNM 23: 2020 Prevnar 20: Recommended Covid booster rec  Flu shot: Done already CCS: cscope 2006,Cscope 2017: polyps, next 05-2022 per GI letter  Prostate ca screening: Complaining of nocturia, DRE showed enlarged prostate but no nodules, no tenderness.  We will check a PSA, UA urine culture Labs: CMP, FLP, CBC, A1c, TSH, PSA, UA urine culture Diet exercise: Discussed Advance care planning package provided

## 2020-12-25 LAB — URINE CULTURE
MICRO NUMBER:: 12669907
SPECIMEN QUALITY:: ADEQUATE

## 2020-12-30 MED ORDER — ATORVASTATIN CALCIUM 20 MG PO TABS: 20.0000 mg | ORAL_TABLET | Freq: Every day | ORAL | 4 refills | Status: DC

## 2020-12-30 NOTE — Addendum Note (Signed)
Addended byDamita Dunnings D on: 12/30/2020 07:37 AM   Modules accepted: Orders

## 2020-12-31 ENCOUNTER — Ambulatory Visit: Payer: BC Managed Care – PPO | Admitting: Family Medicine

## 2021-01-06 ENCOUNTER — Encounter: Payer: Self-pay | Admitting: Internal Medicine

## 2021-01-06 DIAGNOSIS — M722 Plantar fascial fibromatosis: Secondary | ICD-10-CM

## 2021-01-13 ENCOUNTER — Ambulatory Visit (INDEPENDENT_AMBULATORY_CARE_PROVIDER_SITE_OTHER): Payer: Medicare Other | Admitting: Orthopedic Surgery

## 2021-01-13 ENCOUNTER — Ambulatory Visit: Payer: Self-pay

## 2021-01-13 DIAGNOSIS — M722 Plantar fascial fibromatosis: Secondary | ICD-10-CM | POA: Diagnosis not present

## 2021-01-13 DIAGNOSIS — M79672 Pain in left foot: Secondary | ICD-10-CM

## 2021-01-13 DIAGNOSIS — M6702 Short Achilles tendon (acquired), left ankle: Secondary | ICD-10-CM | POA: Diagnosis not present

## 2021-01-14 ENCOUNTER — Encounter: Payer: Self-pay | Admitting: Orthopedic Surgery

## 2021-01-14 NOTE — Progress Notes (Signed)
Office Visit Note   Patient: James [name]           Date of Birth: December 07, 1950           MRN: [id number] Visit Date: 01/13/2021              Requested by: Colon Branch, [address] [place],  [place] [address] PCP: Colon Branch, MD  Chief Complaint  Patient presents with   Left Foot - Pain      HPI: Patient is a 70 year old gentleman who was seen for initial evaluation for a second opinion regarding his Planter fasciitis.  Patient states he has had heel pain for several months.  He states is actually better in the morning but gets worse as the day progresses.  He has had a plantar fascia injection without relief and is also taken oral prednisone.  Patient is currently wearing [place] sneakers.  Assessment & Plan: Visit Diagnoses:  1. Pain of left heel   2. Achilles tendon contracture, left   3. Plantar fasciitis, left     Plan: Recommended a stiff soled sneaker or a carbon plate continue with the over-the-counter orthotic.  Patient was given instructions and demonstrated Achilles stretching as well as fascial strengthening with toe raises.  Follow-up as needed.  Follow-Up Instructions: Return if symptoms worsen or fail to improve.   Ortho Exam  Patient is alert, oriented, no adenopathy, well-dressed, normal affect, normal respiratory effort. Examination patient has a good pulse he has dorsiflexion of the ankle to neutral he is tender to palpation of the origin of the plantar fascia.  Lateral compression of the calcaneus is not tender to palpation the tarsal tunnel is nontender to palpation the peroneal and posterior tibial tendons are not tender to palpation.  Imaging: XR Os Calcis Left  Result Date: 01/14/2021 2 view radiographs of the left calcaneus shows no evidence of a fracture.  There is a large plantar spur.  No images are attached to the encounter.  Labs: Lab Results  Component Value Date   HGBA1C 4.4 (L) 12/24/2020   GRAMSTAIN Rare 10/07/2015    GRAMSTAIN WBC present-predominately PMN 10/07/2015   GRAMSTAIN No Squamous Epithelial Cells Seen 10/07/2015   GRAMSTAIN No Organisms Seen 10/07/2015   LABORGA NO GROWTH 2 DAYS 10/07/2015     Lab Results  Component Value Date   ALBUMIN 4.6 12/24/2020   ALBUMIN 4.6 09/20/2018   ALBUMIN 4.6 09/24/2016    No results found for: MG No results found for: VD25OH  No results found for: PREALBUMIN CBC EXTENDED Latest Ref Rng & Units 12/24/2020 09/20/2018 09/24/2016  WBC 4.0 - 10.5 K/uL 4.4 4.8 3.9(L)  RBC 4.22 - 5.81 Mil/uL 4.57 5.16 5.16  HGB 13.0 - 17.0 g/dL 13.4 14.9 15.3  HCT 39.0 - 52.0 % 38.5(L) 42.8 44.2  PLT 150.0 - 400.0 K/uL 200.0 195.0 183.0  NEUTROABS 1.4 - 7.7 K/uL 2.6 - -  LYMPHSABS 0.7 - 4.0 K/uL 1.2 - -     There is no height or weight on file to calculate BMI.  Orders:  Orders Placed This Encounter  Procedures   XR Os Calcis Left   No orders of the defined types were placed in this encounter.    Procedures: No procedures performed  Clinical Data: No additional findings.  ROS:  All other systems negative, except as noted in the HPI. Review of Systems  Objective: Vital Signs: There were no vitals taken for this visit.  Specialty Comments:  No specialty comments available.  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   Past Medical History:  Diagnosis Date   No known problems     Family History  Problem Relation Age of Onset   Stroke Mother 75   Heart attack Father 66   Cancer Neg Hx    Diabetes Neg Hx    Colon cancer Neg Hx    Colon polyps Neg Hx     Past Surgical History:   Procedure Laterality Date   COLONOSCOPY  2006   Neg, Dr.Gessner   VASECTOMY     Social History   Occupational History   Occupation: [employer] , retired, TEFL teacher    Comment: works part time  Tobacco Use   Smoking status: Never   Smokeless tobacco: Never  Vaping Use   Vaping Use: Never used  Substance and Sexual Activity   Alcohol use: Yes    Alcohol/week: 0.0 standard drinks    Comment: 3 Beer/week   Drug use: No   Sexual activity: Not on file

## 2021-02-11 ENCOUNTER — Other Ambulatory Visit (INDEPENDENT_AMBULATORY_CARE_PROVIDER_SITE_OTHER): Payer: Medicare Other

## 2021-02-11 DIAGNOSIS — E785 Hyperlipidemia, unspecified: Secondary | ICD-10-CM | POA: Diagnosis not present

## 2021-02-11 LAB — AST: AST: 20 U/L (ref 0–37)

## 2021-02-11 LAB — LIPID PANEL
Cholesterol: 95 mg/dL (ref 0–200)
HDL: 49.7 mg/dL (ref >39.00)
LDL Cholesterol: 36 mg/dL (ref 0–99)
NonHDL: 45.1
Total CHOL/HDL Ratio: 2
Triglycerides: 44 mg/dL (ref 0.0–149.0)
VLDL: 8.8 mg/dL (ref 0.0–40.0)

## 2021-02-11 LAB — ALT: ALT: 18 U/L (ref 0–53)

## 2021-02-13 MED ORDER — ATORVASTATIN CALCIUM 20 MG PO TABS: 20.0000 mg | ORAL_TABLET | Freq: Every day | ORAL | 3 refills | Status: DC

## 2021-02-13 NOTE — Addendum Note (Signed)
Addended byDamita Dunnings D on: 02/13/2021 03:12 PM   Modules accepted: Orders

## 2021-03-25 ENCOUNTER — Encounter: Payer: Self-pay | Admitting: Internal Medicine

## 2021-04-15 ENCOUNTER — Ambulatory Visit (INDEPENDENT_AMBULATORY_CARE_PROVIDER_SITE_OTHER): Payer: Medicare Other | Admitting: Internal Medicine

## 2021-04-15 ENCOUNTER — Encounter: Payer: Self-pay | Admitting: Internal Medicine

## 2021-04-15 VITALS — BP 138/84 | HR 70 | Temp 97.8°F | Resp 16 | Ht 74.0 in | Wt 192.0 lb

## 2021-04-15 DIAGNOSIS — R052 Subacute cough: Secondary | ICD-10-CM | POA: Diagnosis not present

## 2021-04-15 DIAGNOSIS — R21 Rash and other nonspecific skin eruption: Secondary | ICD-10-CM

## 2021-04-15 MED ORDER — PANTOPRAZOLE SODIUM 40 MG PO TBEC: 40.0000 mg | DELAYED_RELEASE_TABLET | Freq: Every day | ORAL | 1 refills | Status: DC

## 2021-04-15 MED ORDER — KETOCONAZOLE 2 % EX CREA: 1.0000 "application " | TOPICAL_CREAM | Freq: Every day | CUTANEOUS | 0 refills | Status: DC

## 2021-04-15 MED ORDER — HYDROCORTISONE 2.5 % EX CREA: TOPICAL_CREAM | Freq: Two times a day (BID) | CUTANEOUS | 0 refills | Status: DC

## 2021-04-15 NOTE — Patient Instructions (Addendum)
For cough: ? ?Pantoprazole 40 mg 1 tablet before breakfast.  Prescription sent ? ?Also get over-the-counter Astelin nasal spray: 2 sprays on each side of the nose twice daily (at least at night). ? ?Do that regularly for 1 month, if you are not better let me know. ? ? ?For the rash: ?Apply the 2 creams I sent to your pharmacy twice daily. ? ? ?

## 2021-04-15 NOTE — Progress Notes (Signed)
? ?  Subjective:  ? ? Patient ID: James [name], male    DOB: [birth year], 71 y.o.   MRN: [id number] ? ?DOS:  04/15/2021 ?Type of visit - description: Acute ? ?Chief complaint is cough. ?Cough started about 6 weeks ago, 90% of the time is a dry cough, occasionally sees a small amount of sputum in the morning. ?When asked, admits that at first he probably had a URI characterized by mild sore throat and malaise, that lasted only a couple of days. ? ?Also has a rash on the right abdomen, pruritic, started few weeks ago. ? ?Review of Systems ?No fever chills ?Has lost some weight, on purpose, eating healthier. ?No chest pain or difficulty breathing ?No major problems with allergies ?No classic heartburn ?No wheezing. ? ?Past Medical History:  ?Diagnosis Date  ? No known problems   ? ? ?Past Surgical History:  ?Procedure Laterality Date  ? COLONOSCOPY  2006  ? Neg, Dr.Gessner  ? VASECTOMY    ? ? ?Current Outpatient Medications  ?Medication Instructions  ? atorvastatin (LIPITOR) 20 mg, Oral, Daily at bedtime  ? predniSONE (DELTASONE) 10 MG tablet 4 tablets x 2 days, 3 tabs x 2 days, 2 tabs x 2 days, 1 tab x 2 days  ? ? ?   ?Objective:  ? Physical Exam ?Skin: ? ?    ?   Comments: Rash at the RLQ  ? ?BP 138/84 (BP Location: Left Arm, Patient Position: Sitting, Cuff Size: Small)   Pulse 70   Temp 97.8 ?F (36.6 ?C) (Oral)   Resp 16   Ht '6\' 2"'$  (1.88 m)   Wt 192 lb (87.1 kg)   SpO2 97%   BMI 24.65 kg/m?  ?General:   ?Well developed, NAD, BMI noted. ?HEENT:  ?Normocephalic . Face symmetric, atraumatic ?TMs: Normal.  Throat: Symmetric, not red. ?Sinuses non-TTP.  Nose minimal congestion ?Lungs:  ?CTA B ?Normal respiratory effort, no intercostal retractions, no accessory muscle use. ?Heart: RRR,  no murmur.  ?Lower extremities: no pretibial edema bilaterally  ?Skin: ?At the right abdomen, has a maculopapular rash, looks like 2 confluent rings, no blisters.   ?Neurologic:  ?alert & oriented X3.  ?Speech normal, gait appropriate  for age and unassisted ?Psych--  ?Cognition and judgment appear intact.  ?Cooperative with normal attention span and concentration.  ?Behavior appropriate. ?No anxious or depressed appearing.  ? ?   ?Assessment   ? ?Assessment ?Allergies ?Rosacea ?Rosanna Randy syndrome ?FH CAD father age 57 ? ?PLAN ?Cough: ?Going on for 6 weeks, no red flags, never smoker.  Possibly triggered by cold?.  DDx includes GERD and allergies. ?Plan: Consistent use of pantoprazole, Astepro for 1 month.  See instructions, call if not better. ?Rash: ?Pruritic, possibly fungal versus eczema.  Recommend Nizoral and hydrocortisone twice daily for a week. ?  ? ? ?This visit occurred during the SARS-CoV-2 public health emergency.  Safety protocols were in place, including screening questions prior to the visit, additional usage of staff PPE, and extensive cleaning of exam room while observing appropriate contact time as indicated for disinfecting solutions.  ? ?

## 2021-04-17 NOTE — Assessment & Plan Note (Signed)
Cough: ?Going on for 6 weeks, no red flags, never smoker.  Possibly triggered by cold?.  DDx includes GERD and allergies. ?Plan: Consistent use of pantoprazole, Astepro for 1 month.  See instructions, call if not better. ?Rash: ?Pruritic, possibly fungal versus eczema.  Recommend Nizoral and hydrocortisone twice daily for a week. ?  ?

## 2021-06-03 ENCOUNTER — Telehealth: Payer: Self-pay | Admitting: Internal Medicine

## 2021-06-03 NOTE — Telephone Encounter (Signed)
Spoke with patient to schedule Medicare Annual Wellness Visit (AWV) either virtually or phone. ? ?Pt stated he would call back  traveling now ? ?AWVI DUE 01/03/2019 PER [facility] ?please schedule at anytime with health coach ? ?This should be a 45 minute visit.  ? ?I gave pt my direct number [phone] ?

## 2021-09-01 ENCOUNTER — Ambulatory Visit (INDEPENDENT_AMBULATORY_CARE_PROVIDER_SITE_OTHER): Payer: Medicare Other

## 2021-09-01 VITALS — Ht 74.0 in | Wt 192.0 lb

## 2021-09-01 DIAGNOSIS — Z0000 Encounter for general adult medical examination without abnormal findings: Secondary | ICD-10-CM

## 2021-09-01 NOTE — Progress Notes (Addendum)
Subjective:   James [name] is a 71 y.o. male who presents for an Initial Medicare Annual Wellness Visit.   I connected with Kiwan today by telephone and verified that I am speaking with the correct person using two identifiers. Location patient: home Location provider: work Persons participating in the virtual visit: patient, Marine scientist.    I discussed the limitations, risks, security and privacy concerns of performing an evaluation and management service by telephone and the availability of in person appointments. I also discussed with the patient that there may be a patient responsible charge related to this service. The patient expressed understanding and verbally consented to this telephonic visit.    Interactive audio and video telecommunications were attempted between this provider and patient, however failed, due to patient having technical difficulties OR patient did not have access to video capability.  We continued and completed visit with audio only.  Some vital signs may be absent or patient reported.   Time Spent with patient on telephone encounter: 20 minutes  Review of Systems     Cardiac Risk Factors include: advanced age (>51mn, >>47women);male gender;dyslipidemia     Objective:    Today's Vitals   09/01/21 1406  Weight: 192 lb (87.1 kg)  Height: '6\' 2"'$  (1.88 m)   Body mass index is 24.65 kg/m.     09/01/2021    2:09 PM 05/16/2015   10:50 AM  Advanced Directives  Does Patient Have a Medical Advance Directive? Yes Yes  Type of [place]aramed[place]of [place]ew [place] will  Copy of [place] Chart? No - copy requested     Current Medications (verified) Outpatient Encounter Medications as of 09/01/2021  Medication Sig   atorvastatin (LIPITOR) 20 MG tablet Take 1 tablet (20 mg total) by mouth at bedtime.   Omega-3 Fatty Acids (FISH OIL) 1000 MG CAPS Take by mouth.   hydrocortisone  2.5 % cream Apply topically 2 (two) times daily. (Patient not taking: Reported on 09/01/2021)   ketoconazole (NIZORAL) 2 % cream Apply 1 application. topically daily. (Patient not taking: Reported on 09/01/2021)   pantoprazole (PROTONIX) 40 MG tablet Take 1 tablet (40 mg total) by mouth daily before breakfast. (Patient not taking: Reported on 09/01/2021)   No facility-administered encounter medications on file as of 09/01/2021.    Allergies (verified) Patient has no known allergies.   History: Past Medical History:  Diagnosis Date   No known problems    Past Surgical History:  Procedure Laterality Date   COLONOSCOPY  2006   Neg, Dr.Gessner   VASECTOMY     Family History  Problem Relation Age of Onset   Stroke Mother 831  Heart attack Father 444  Cancer Neg Hx    Diabetes Neg Hx    Colon cancer Neg Hx    Colon polyps Neg Hx    Social History   Socioeconomic History   Marital status: Married    Spouse name: Not on file   Number of children: 2   Years of education: Not on file   Highest education level: Not on file  Occupational History   Occupation: [employer] , retired, mTEFL teacher   Comment: works part time  Tobacco Use   Smoking status: Never   Smokeless tobacco: Never  Vaping Use   Vaping Use: Never used  Substance and Sexual Activity   Alcohol use: Yes    Alcohol/week: 0.0 standard drinks of alcohol  Comment: 3 Beer/week   Drug use: No   Sexual activity: Not on file  Other Topics Concern   Not on file  Social History Narrative   Household= pt and wife   Social Determinants of Health   Financial Resource Strain: Low Risk  (09/01/2021)   Overall Financial Resource Strain (CARDIA)    Difficulty of Paying Living Expenses: Not hard at all  Food Insecurity: No Food Insecurity (09/01/2021)   Hunger Vital Sign    Worried About Running Out of Food in the Last Year: Never true    Ran Out of Food in the Last Year: Never true  Transportation Needs: No Transportation  Needs (09/01/2021)   PRAPARE - Hydrologist (Medical): No    Lack of Transportation (Non-Medical): No  Physical Activity: Sufficiently Active (09/01/2021)   Exercise Vital Sign    Days of Exercise per Week: 3 days    Minutes of Exercise per Session: 60 min  Stress: No Stress Concern Present (09/01/2021)   [place]    Feeling of Stress : Not at all  Social Connections: Moderately Integrated (09/01/2021)   Social Connection and Isolation Panel [NHANES]    Frequency of Communication with Friends and Family: More than three times a week    Frequency of Social Gatherings with Friends and Family: More than three times a week    Attends Religious Services: More than 4 times per year    Active Member of [employer] or Organizations: No    Attends Music therapist: Never    Marital Status: Married    Tobacco Counseling Counseling given: Not Answered   Clinical Intake:  Pre-visit preparation completed: Yes  Pain : No/denies pain     BMI - recorded: 24.65 Nutritional Status: BMI of 19-24  Normal Nutritional Risks: None Diabetes: No  How often do you need to have someone help you when you read instructions, pamphlets, or other written materials from your doctor or pharmacy?: 1 - Never  Diabetic?No  Interpreter Needed?: No  Information entered by :: Caroleen Hamman LPN   Activities of Daily Living    09/01/2021    2:12 PM 12/24/2020   12:57 PM  In your present state of health, do you have any difficulty performing the following activities:  Hearing? 0 0  Vision? 0 0  Difficulty concentrating or making decisions? 0 0  Walking or climbing stairs? 0 0  Dressing or bathing? 0 0  Doing errands, shopping? 0 0  Preparing Food and eating ? N   Using the Toilet? N   In the past six months, have you accidently leaked urine? N   Do you have problems with loss of bowel control? N    Managing your Medications? N   Managing your Finances? N   Housekeeping or managing your Housekeeping? N     Patient Care Team: Colon Branch, MD as PCP - General (Internal Medicine)  Indicate any recent Medical Services you may have received from other than [facility] providers in the past year (date may be approximate).     Assessment:   This is a routine wellness examination for James [name].  Hearing/Vision screen Hearing Screening - Comments:: No issues Vision Screening - Comments:: Last eye exam-01/2022-[facility]  Dietary issues and exercise activities discussed: Current Exercise Habits: Home exercise routine, Type of exercise: strength training/weights;walking, Time (Minutes): 60, Frequency (Times/Week): 3, Weekly Exercise (Minutes/Week): 180, Intensity: Mild, Exercise limited by:  None identified   Goals Addressed             This Visit's Progress    Patient Stated       Maintain current health & continue to exercise       Depression Screen    09/01/2021    2:12 PM 12/24/2020   12:57 PM 11/04/2020    8:42 AM 10/18/2018   11:16 AM  PHQ 2/9 Scores  PHQ - 2 Score 0 0 0 0    Fall Risk    09/01/2021    2:11 PM 12/24/2020   12:57 PM 11/04/2020    8:42 AM  Fall Risk   Falls in the past year? 0 0 0  Number falls in past yr: 0 0 0  Injury with Fall? 0 0 0  Follow up Falls prevention discussed Falls evaluation completed Falls evaluation completed    [place]:  Any stairs in or around the home? No  Home free of loose throw rugs in walkways, pet beds, electrical cords, etc? Yes  Adequate lighting in your home to reduce risk of falls? Yes   ASSISTIVE DEVICES UTILIZED TO PREVENT FALLS:  Life alert? No  Use of a cane, walker or w/c? No  Grab bars in the bathroom? No  Shower chair or bench in shower? No  Elevated toilet seat or a handicapped toilet? No   TIMED UP AND GO:  Was the test performed? No . Phone visit   Cognitive  Function:Normal cognitive status assessed by this Nurse Health Advisor. No abnormalities found.          Immunizations Immunization History  Administered Date(s) Administered   Fluad Quad(high Dose 65+) 10/18/2018   Influenza,inj,Quad PF,6+ Mos 01/12/2014, 12/28/2017   Influenza-Unspecified 12/20/2019, 12/10/2020   Moderna Sars-Covid-2 Vaccination 02/27/2019, 03/27/2019, 01/03/2020   Pneumococcal Polysaccharide-23 09/20/2018   Tdap 10/07/2015    TDAP status: Up to date  Flu Vaccine status: Up to date  Pneumococcal vaccine status: Due, Education has been provided regarding the importance of this vaccine. Advised may receive this vaccine at local pharmacy or Health Dept. Aware to provide a copy of the vaccination record if obtained from local pharmacy or Health Dept. Verbalized acceptance and understanding.  Covid-19 vaccine status: Information provided on how to obtain vaccines.   Qualifies for Shingles Vaccine? Yes   Zostavax completed No   Shingrix Completed?: No.    Education has been provided regarding the importance of this vaccine. Patient has been advised to call insurance company to determine out of pocket expense if they have not yet received this vaccine. Advised may also receive vaccine at local pharmacy or Health Dept. Verbalized acceptance and understanding.  Screening Tests Health Maintenance  Topic Date Due   Zoster Vaccines- Shingrix (1 of 2) Never done   Pneumonia Vaccine 6+ Years old (2 - PCV) 09/20/2019   COVID-19 Vaccine (4 - Moderna series) 02/28/2020   INFLUENZA VACCINE  09/02/2021   COLONOSCOPY (Pts 45-8yr Insurance coverage will need to be confirmed)  05/29/2022   TETANUS/TDAP  10/06/2025   Hepatitis C Screening  Completed   HPV VACCINES  Aged Out    Health Maintenance  Health Maintenance Due  Topic Date Due   Zoster Vaccines- Shingrix (1 of 2) Never done   Pneumonia Vaccine 69 Years old (2 - PCV) 09/20/2019   COVID-19 Vaccine (4 - Moderna  series) 02/28/2020    Colorectal cancer screening: Type of screening: Colonoscopy. Completed 05/29/2015. Repeat  every 7 years  Lung Cancer Screening: (Low Dose CT Chest recommended if Age 46-80 years, 30 pack-year currently smoking OR have quit w/in 15years.) does not qualify.     Additional Screening:  Hepatitis C Screening: Completed 09/24/2016  Vision Screening: Recommended annual ophthalmology exams for early detection of glaucoma and other disorders of the eye. Is the patient up to date with their annual eye exam?  Yes  Who is the provider or what is the name of the office in which the patient attends annual eye exams? Eye lab   Dental Screening: Recommended annual dental exams for proper oral hygiene  Community Resource Referral / Chronic Care Management: CRR required this visit?  No   CCM required this visit?  No      Plan:     I have personally reviewed and noted the following in the patient's chart:   Medical and social history Use of alcohol, tobacco or illicit drugs  Current medications and supplements including opioid prescriptions. Patient is not currently taking opioid prescriptions. Functional ability and status Nutritional status Physical activity Advanced directives List of other physicians Hospitalizations, surgeries, and ER visits in previous 12 months Vitals Screenings to include cognitive, depression, and falls Referrals and appointments  In addition, I have reviewed and discussed with patient certain preventive protocols, quality metrics, and best practice recommendations. A written personalized care plan for preventive services as well as general preventive health recommendations were provided to patient.   Due to this being a telephonic visit, the after visit summary with patients personalized plan was offered to patient via mail or my-chart. Patient would like to access on my-chart.  Marta Antu, LPN   03/30/3333  Nurse Health  Advisor  Nurse Notes: None  I have reviewed and agree with Health Coaches documentation.  Kathlene November, MD

## 2021-09-01 NOTE — Patient Instructions (Signed)
Mr. James [name] , Thank you for taking time to complete your Medicare Wellness Visit. I appreciate your ongoing commitment to your health goals. Please review the following plan we discussed and let me know if I can assist you in the future.   Screening recommendations/referrals: Colonoscopy: Completed 05/29/2015-Due 05/29/2022 Recommended yearly ophthalmology/optometry visit for glaucoma screening and checkup Recommended yearly dental visit for hygiene and checkup  Vaccinations: Influenza vaccine: Up to date Pneumococcal vaccine: Due-May obtain vaccine at our office or your local pharmacy. Tdap vaccine: Up to date Shingles vaccine: Due-May obtain vaccine at your local pharmacy. Covid-19: May obtain booster at your local pharmacy  Advanced directives: Please bring a copy of Living Will and/or [place] for your chart.   Conditions/risks identified: See problem list  Next appointment: Follow up in one year for your annual wellness visit.   Preventive Care 69 Years and Older, Male Preventive care refers to lifestyle choices and visits with your health care provider that can promote health and wellness. What does preventive care include? A yearly physical exam. This is also called an annual well check. Dental exams once or twice a year. Routine eye exams. Ask your health care provider how often you should have your eyes checked. Personal lifestyle choices, including: Daily care of your teeth and gums. Regular physical activity. Eating a healthy diet. Avoiding tobacco and drug use. Limiting alcohol use. Practicing safe sex. Taking low doses of aspirin every day. Taking vitamin and mineral supplements as recommended by your health care provider. What happens during an annual well check? The services and screenings done by your health care provider during your annual well check will depend on your age, overall health, lifestyle risk factors, and family history of  disease. Counseling  Your health care provider may ask you questions about your: Alcohol use. Tobacco use. Drug use. Emotional well-being. Home and relationship well-being. Sexual activity. Eating habits. History of falls. Memory and ability to understand (cognition). Work and work Statistician. Screening  You may have the following tests or measurements: Height, weight, and BMI. Blood pressure. Lipid and cholesterol levels. These may be checked every 5 years, or more frequently if you are over 32 years old. Skin check. Lung cancer screening. You may have this screening every year starting at age 64 if you have a 30-pack-year history of smoking and currently smoke or have quit within the past 15 years. Fecal occult blood test (FOBT) of the stool. You may have this test every year starting at age 41. Flexible sigmoidoscopy or colonoscopy. You may have a sigmoidoscopy every 5 years or a colonoscopy every 10 years starting at age 46. Prostate cancer screening. Recommendations will vary depending on your family history and other risks. Hepatitis C blood test. Hepatitis B blood test. Sexually transmitted disease (STD) testing. Diabetes screening. This is done by checking your blood sugar (glucose) after you have not eaten for a while (fasting). You may have this done every 1-3 years. Abdominal aortic aneurysm (AAA) screening. You may need this if you are a current or former smoker. Osteoporosis. You may be screened starting at age 26 if you are at high risk. Talk with your health care provider about your test results, treatment options, and if necessary, the need for more tests. Vaccines  Your health care provider may recommend certain vaccines, such as: Influenza vaccine. This is recommended every year. Tetanus, diphtheria, and acellular pertussis (Tdap, Td) vaccine. You may need a Td booster every 10 years. Zoster vaccine. You  may need this after age 75. Pneumococcal 13-valent  conjugate (PCV13) vaccine. One dose is recommended after age 41. Pneumococcal polysaccharide (PPSV23) vaccine. One dose is recommended after age 64. Talk to your health care provider about which screenings and vaccines you need and how often you need them. This information is not intended to replace advice given to you by your health care provider. Make sure you discuss any questions you have with your health care provider. Document Released: 02/15/2015 Document Revised: 10/09/2015 Document Reviewed: 11/20/2014 Elsevier Interactive Patient Education  2017 [place] Prevention in the Home Falls can cause injuries. They can happen to people of all ages. There are many things you can do to make your home safe and to help prevent falls. What can I do on the outside of my home? Regularly fix the edges of walkways and driveways and fix any cracks. Remove anything that might make you trip as you walk through a door, such as a raised step or threshold. Trim any bushes or trees on the path to your home. Use bright outdoor lighting. Clear any walking paths of anything that might make someone trip, such as rocks or tools. Regularly check to see if handrails are loose or broken. Make sure that both sides of any steps have handrails. Any raised decks and porches should have guardrails on the edges. Have any leaves, snow, or ice cleared regularly. Use sand or salt on walking paths during winter. Clean up any spills in your garage right away. This includes oil or grease spills. What can I do in the bathroom? Use night lights. Install grab bars by the toilet and in the tub and shower. Do not use towel bars as grab bars. Use non-skid mats or decals in the tub or shower. If you need to sit down in the shower, use a plastic, non-slip stool. Keep the floor dry. Clean up any water that spills on the floor as soon as it happens. Remove soap buildup in the tub or shower regularly. Attach bath mats  securely with double-sided non-slip rug tape. Do not have throw rugs and other things on the floor that can make you trip. What can I do in the bedroom? Use night lights. Make sure that you have a light by your bed that is easy to reach. Do not use any sheets or blankets that are too big for your bed. They should not hang down onto the floor. Have a firm chair that has side arms. You can use this for support while you get dressed. Do not have throw rugs and other things on the floor that can make you trip. What can I do in the kitchen? Clean up any spills right away. Avoid walking on wet floors. Keep items that you use a lot in easy-to-reach places. If you need to reach something above you, use a strong step stool that has a grab bar. Keep electrical cords out of the way. Do not use floor polish or wax that makes floors slippery. If you must use wax, use non-skid floor wax. Do not have throw rugs and other things on the floor that can make you trip. What can I do with my stairs? Do not leave any items on the stairs. Make sure that there are handrails on both sides of the stairs and use them. Fix handrails that are broken or loose. Make sure that handrails are as long as the stairways. Check any carpeting to make sure that it is firmly  attached to the stairs. Fix any carpet that is loose or worn. Avoid having throw rugs at the top or bottom of the stairs. If you do have throw rugs, attach them to the floor with carpet tape. Make sure that you have a light switch at the top of the stairs and the bottom of the stairs. If you do not have them, ask someone to add them for you. What else can I do to help prevent falls? Wear shoes that: Do not have high heels. Have rubber bottoms. Are comfortable and fit you well. Are closed at the toe. Do not wear sandals. If you use a stepladder: Make sure that it is fully opened. Do not climb a closed stepladder. Make sure that both sides of the stepladder  are locked into place. Ask someone to hold it for you, if possible. Clearly mark and make sure that you can see: Any grab bars or handrails. First and last steps. Where the edge of each step is. Use tools that help you move around (mobility aids) if they are needed. These include: Canes. Walkers. Scooters. Crutches. Turn on the lights when you go into a dark area. Replace any light bulbs as soon as they burn out. Set up your furniture so you have a clear path. Avoid moving your furniture around. If any of your floors are uneven, fix them. If there are any pets around you, be aware of where they are. Review your medicines with your doctor. Some medicines can make you feel dizzy. This can increase your chance of falling. Ask your doctor what other things that you can do to help prevent falls. This information is not intended to replace advice given to you by your health care provider. Make sure you discuss any questions you have with your health care provider. Document Released: 11/15/2008 Document Revised: 06/27/2015 Document Reviewed: 02/23/2014 Elsevier Interactive Patient Education  2017 [employer].

## 2021-10-12 ENCOUNTER — Encounter: Payer: Self-pay | Admitting: Internal Medicine

## 2021-10-14 ENCOUNTER — Encounter: Payer: Self-pay | Admitting: Internal Medicine

## 2021-10-14 ENCOUNTER — Ambulatory Visit (INDEPENDENT_AMBULATORY_CARE_PROVIDER_SITE_OTHER): Payer: Medicare Other | Admitting: Internal Medicine

## 2021-10-14 VITALS — BP 132/80 | HR 87 | Temp 98.0°F | Resp 16 | Ht 74.0 in | Wt 198.4 lb

## 2021-10-14 DIAGNOSIS — Z23 Encounter for immunization: Secondary | ICD-10-CM | POA: Diagnosis not present

## 2021-10-14 DIAGNOSIS — L03115 Cellulitis of right lower limb: Secondary | ICD-10-CM

## 2021-10-14 MED ORDER — DOXYCYCLINE HYCLATE 100 MG PO TABS: 100.0000 mg | ORAL_TABLET | Freq: Two times a day (BID) | ORAL | 0 refills | Status: DC

## 2021-10-14 MED ORDER — MUPIROCIN 2 % EX OINT: 1.0000 | TOPICAL_OINTMENT | Freq: Two times a day (BID) | CUTANEOUS | 0 refills | Status: DC

## 2021-10-14 NOTE — Progress Notes (Unsigned)
   Subjective:    Patient ID: James [name], male    DOB: August 19, 1950, 71 y.o.   MRN: [id number]  DOS:  10/14/2021 Type of visit - description: Acute A week ago, cannot himself while carrying a kayak down to the river. cover the area with a Band-Aid for few days later realized that the area was getting red and smelled bad. Does not recall seeing any discharge.  No fever or chills. Overall in the last couple of days they have is not getting better or worse.  Review of Systems See above   Past Medical History:  Diagnosis Date   No known problems     Past Surgical History:  Procedure Laterality Date   COLONOSCOPY  2006   Neg, Dr.Gessner   VASECTOMY      Current Outpatient Medications  Medication Instructions   atorvastatin (LIPITOR) 20 mg, Oral, Daily at bedtime   hydrocortisone 2.5 % cream Topical, 2 times daily   ketoconazole (NIZORAL) 2 % cream 1 application , Topical, Daily   Omega-3 Fatty Acids (FISH OIL) 1000 MG CAPS Oral   pantoprazole (PROTONIX) 40 mg, Oral, Daily before breakfast       Objective:   Physical Exam BP 132/80   Pulse 87   Temp 98 F (36.7 C) (Oral)   Resp 16   Ht '6\' 2"'$  (1.88 m)   Wt 198 lb 6 oz (90 kg)   SpO2 98%   BMI 25.47 kg/m  General:   Well developed, NAD, BMI noted. HEENT:  Normocephalic . Face symmetric, atraumatic   Lower extremities: no pretibial edema bilaterally.  See picture of the right leg.  No abscess or pockets of pus.  No discharge. Skin: Not pale. Not jaundice Neurologic:  alert & oriented X3.  Speech normal, gait appropriate for age and unassisted Psych--  Cognition and judgment appear intact.  Cooperative with normal attention span and concentration.  Behavior appropriate. No anxious or depressed appearing.       Assessment     Assessment Allergies Rosacea Rosanna Randy syndrome FH CAD father age 80  PLAN Cellulitis, R leg. Findings consistent with cellulitis, no evidence of abscess. Plan:  Td updated  today, doxycycline x1 week (avoid excessive sun exposure) mupirocin twice daily, call if not gradually better, call if worse. Planning to go to the beach in few days, avoid seawater exposure

## 2021-10-14 NOTE — Patient Instructions (Signed)
Take the antibiotic called doxycycline for 1 week  Use the antibiotic cream twice daily for 10 days  Keep the area clean and dry  Avoid exposure to sea water  Call if not gradually better, call if the area gets worse

## 2021-10-15 NOTE — Assessment & Plan Note (Signed)
Cellulitis, R leg. Findings consistent with cellulitis, no evidence of abscess. Plan:  Td booster today, doxycycline x1 week (avoid excessive sun exposure), mupirocin twice daily, call if not gradually better, call if worse. Planning to go to the beach in few days, avoid seawater exposure

## 2021-10-22 ENCOUNTER — Encounter: Payer: Self-pay | Admitting: Internal Medicine

## 2021-12-30 ENCOUNTER — Encounter: Payer: Self-pay | Admitting: Internal Medicine

## 2021-12-30 ENCOUNTER — Ambulatory Visit (INDEPENDENT_AMBULATORY_CARE_PROVIDER_SITE_OTHER): Payer: Medicare Other | Admitting: Internal Medicine

## 2021-12-30 VITALS — BP 138/82 | HR 67 | Temp 98.2°F | Resp 16 | Ht 74.0 in | Wt 203.4 lb

## 2021-12-30 DIAGNOSIS — Z23 Encounter for immunization: Secondary | ICD-10-CM | POA: Diagnosis not present

## 2021-12-30 DIAGNOSIS — N429 Disorder of prostate, unspecified: Secondary | ICD-10-CM

## 2021-12-30 DIAGNOSIS — Z0000 Encounter for general adult medical examination without abnormal findings: Secondary | ICD-10-CM

## 2021-12-30 DIAGNOSIS — E785 Hyperlipidemia, unspecified: Secondary | ICD-10-CM

## 2021-12-30 LAB — CBC WITH DIFFERENTIAL/PLATELET
Basophils Absolute: 0.1 10*3/uL (ref 0.0–0.1)
Basophils Relative: 1.2 % (ref 0.0–3.0)
Eosinophils Absolute: 0.1 10*3/uL (ref 0.0–0.7)
Eosinophils Relative: 2.8 % (ref 0.0–5.0)
HCT: 41.7 % (ref 39.0–52.0)
Hemoglobin: 14.5 g/dL (ref 13.0–17.0)
Lymphocytes Relative: 25.3 % (ref 12.0–46.0)
Lymphs Abs: 1.3 10*3/uL (ref 0.7–4.0)
MCHC: 34.8 g/dL (ref 30.0–36.0)
MCV: 82.2 fl (ref 78.0–100.0)
Monocytes Absolute: 0.5 10*3/uL (ref 0.1–1.0)
Monocytes Relative: 9.2 % (ref 3.0–12.0)
Neutro Abs: 3.2 10*3/uL (ref 1.4–7.7)
Neutrophils Relative %: 61.5 % (ref 43.0–77.0)
Platelets: 186 10*3/uL (ref 150.0–400.0)
RBC: 5.07 Mil/uL (ref 4.22–5.81)
RDW: 14.3 % (ref 11.5–15.5)
WBC: 5.2 10*3/uL (ref 4.0–10.5)

## 2021-12-30 LAB — LIPID PANEL
Cholesterol: 96 mg/dL (ref 0–200)
HDL: 54.8 mg/dL (ref >39.00)
LDL Cholesterol: 34 mg/dL (ref 0–99)
NonHDL: 41.11
Total CHOL/HDL Ratio: 2
Triglycerides: 34 mg/dL (ref 0.0–149.0)
VLDL: 6.8 mg/dL (ref 0.0–40.0)

## 2021-12-30 LAB — COMPREHENSIVE METABOLIC PANEL
ALT: 21 U/L (ref 0–53)
AST: 20 U/L (ref 0–37)
Albumin: 4.4 g/dL (ref 3.5–5.2)
Alkaline Phosphatase: 58 U/L (ref 39–117)
BUN: 14 mg/dL (ref 6–23)
CO2: 28 mEq/L (ref 19–32)
Calcium: 9.2 mg/dL (ref 8.4–10.5)
Chloride: 104 mEq/L (ref 96–112)
Creatinine, Ser: 0.98 mg/dL (ref 0.40–1.50)
GFR: 78.98 mL/min (ref >60.00)
Glucose, Bld: 84 mg/dL (ref 70–99)
Potassium: 4.5 mEq/L (ref 3.5–5.1)
Sodium: 140 mEq/L (ref 135–145)
Total Bilirubin: 1 mg/dL (ref 0.2–1.2)
Total Protein: 6.7 g/dL (ref 6.0–8.3)

## 2021-12-30 LAB — PSA: PSA: 3.41 ng/mL (ref 0.10–4.00)

## 2021-12-30 NOTE — Assessment & Plan Note (Signed)
Tdap 2017 Shingrix 11-2021, next within 6 months.  Patient aware.   PNM 23: 2020 RSV d/w pt  Prevnar 20: Recommended, ok to wait for next year Covid booster rec  Flu shot: today [facility]: cscope 2006,Cscope 2017: polyps, next 05-2022 per GI letter  Prostate ca screening:  DRE slightly asymmetric, has nocturia, last PSA 3.1.  Plan: Recheck PSA, no urgent urology referral d/t  asymmetric prostate. Labs: CMP FLP CBC PSA Diet exercise: Discussed Advance care planning: Discussed

## 2021-12-30 NOTE — Progress Notes (Signed)
Subjective:    Patient ID: James [name], male    DOB: [birth year], 71 y.o.   MRN: [id number]  DOS:  12/30/2021 Type of visit - description: cpx  In general feeling well. Still have some nocturia on and off.  Denies dysuria or gross hematuria.  Review of Systems  Other than above, a 14 point review of systems is negative      Past Medical History:  Diagnosis Date   No known problems     Past Surgical History:  Procedure Laterality Date   COLONOSCOPY  2006   Neg, Dr.Gessner   VASECTOMY     Social History   Socioeconomic History   Marital status: Married    Spouse name: Not on file   Number of children: 2   Years of education: Not on file   Highest education level: Not on file  Occupational History   Occupation: [employer] , retired, TEFL teacher    Comment: works part time  Tobacco Use   Smoking status: Never   Smokeless tobacco: Never  Vaping Use   Vaping Use: Never used  Substance and Sexual Activity   Alcohol use: Yes    Alcohol/week: 0.0 standard drinks of alcohol    Comment: 3 Beer/week   Drug use: No   Sexual activity: Not on file  Other Topics Concern   Not on file  Social History Narrative   Household= pt and wife   Social Determinants of Health   Financial Resource Strain: Low Risk  (09/01/2021)   Overall Financial Resource Strain (CARDIA)    Difficulty of Paying Living Expenses: Not hard at all  Food Insecurity: No Food Insecurity (09/01/2021)   Hunger Vital Sign    Worried About Running Out of Food in the Last Year: Never true    Ran Out of Food in the Last Year: Never true  Transportation Needs: No Transportation Needs (09/01/2021)   PRAPARE - Hydrologist (Medical): No    Lack of Transportation (Non-Medical): No  Physical Activity: Sufficiently Active (09/01/2021)   Exercise Vital Sign    Days of Exercise per Week: 3 days    Minutes of Exercise per Session: 60 min  Stress: No Stress Concern Present (09/01/2021)    [place]    Feeling of Stress : Not at all  Social Connections: Moderately Integrated (09/01/2021)   Social Connection and Isolation Panel [NHANES]    Frequency of Communication with Friends and Family: More than three times a week    Frequency of Social Gatherings with Friends and Family: More than three times a week    Attends Religious Services: More than 4 times per year    Active Member of [employer] or Organizations: No    Attends Archivist Meetings: Never    Marital Status: Married  Human resources officer Violence: Not At Risk (09/01/2021)   Humiliation, Afraid, Rape, and Kick questionnaire    Fear of Current or Ex-Partner: No    Emotionally Abused: No    Physically Abused: No    Sexually Abused: No     Current Outpatient Medications  Medication Instructions   atorvastatin (LIPITOR) 20 mg, Oral, Daily at bedtime   Omega-3 Fatty Acids (FISH OIL) 1000 MG CAPS Oral       Objective:   Physical Exam BP 138/82   Pulse 67   Temp 98.2 F (36.8 C) (Oral)   Resp 16   Ht '6\' 2"'$  (  1.88 m)   Wt 203 lb 6 oz (92.3 kg)   SpO2 97%   BMI 26.11 kg/m  General: Well developed, NAD, BMI noted Neck: No  thyromegaly  HEENT:  Normocephalic . Face symmetric, atraumatic Lungs:  CTA B Normal respiratory effort, no intercostal retractions, no accessory muscle use. Heart: RRR,  no murmur.  Abdomen:  Not distended, soft, non-tender. No rebound or rigidity.   Lower extremities: no pretibial edema bilaterally DRE: Enlarged prostate, more noticeable on the L side which is also slightly more firm Skin: Exposed areas without rash. Not pale. Not jaundice Neurologic:  alert & oriented X3.  Speech normal, gait appropriate for age and unassisted Strength symmetric and appropriate for age.  Psych: Cognition and judgment appear intact.  Cooperative with normal attention span and concentration.  Behavior appropriate. No anxious  or depressed appearing.     Assessment    Assessment hyperlipidemia Allergies Rosacea Rosanna Randy syndrome FH CAD father age 75  PLAN Here for CPX Hyperlipidemia: Has a FH CAD at early age (father) on atorvastatin, checking labs. RTC 1 year

## 2021-12-30 NOTE — Patient Instructions (Addendum)
Vaccines I recommend:  Covid booster RSV vaccine Shingrix No. 2   Urology referral.  Your prostate is enlarged, more so on the left  GO TO THE LAB : Get the blood work     [place], [place] back for a physical exam in 1 year   Do you have a "Living will" or "[place] of attorney"? (Advance care planning documents)  If you already have a living will or healthcare power of attorney, is recommended you bring the copy to be scanned in your chart. The document will be available to all the doctors you see in the system.  If you don't have one, please consider create one.     More information at:  [email or web address]

## 2021-12-30 NOTE — Assessment & Plan Note (Signed)
Here for CPX Hyperlipidemia: Has a FH CAD at early age (father) on atorvastatin, checking labs. RTC 1 year

## 2022-01-02 ENCOUNTER — Encounter: Payer: Self-pay | Admitting: Internal Medicine

## 2022-02-02 HISTORY — PX: INGUINAL HERNIA REPAIR: SUR1180

## 2022-03-24 ENCOUNTER — Other Ambulatory Visit: Payer: Self-pay | Admitting: Internal Medicine

## 2022-04-07 ENCOUNTER — Encounter: Payer: BC Managed Care – PPO | Admitting: Urology

## 2022-04-14 ENCOUNTER — Encounter: Payer: Self-pay | Admitting: Urology

## 2022-04-14 ENCOUNTER — Ambulatory Visit (INDEPENDENT_AMBULATORY_CARE_PROVIDER_SITE_OTHER): Payer: Medicare Other | Admitting: Urology

## 2022-04-14 VITALS — BP 151/103 | HR 74 | Ht 74.0 in | Wt 195.0 lb

## 2022-04-14 DIAGNOSIS — N401 Enlarged prostate with lower urinary tract symptoms: Secondary | ICD-10-CM | POA: Diagnosis not present

## 2022-04-14 DIAGNOSIS — N138 Other obstructive and reflux uropathy: Secondary | ICD-10-CM

## 2022-04-14 DIAGNOSIS — R339 Retention of urine, unspecified: Secondary | ICD-10-CM

## 2022-04-14 LAB — URINALYSIS
Bilirubin, UA: NEGATIVE
Blood, UA: NEGATIVE
Glucose, UA: NEGATIVE mg/dL
Ketones, UA: NEGATIVE
Leukocytes, UA: NEGATIVE
Nitrite, UA: NEGATIVE
Protein, UA: NEGATIVE
Spec Grav, UA: 1.01 (ref 1.010–1.025)
Urobilinogen, UA: 0.2 E.U./dL
pH, UA: 6.5 (ref 5.0–8.0)

## 2022-04-14 LAB — BLADDER SCAN AMB NON-IMAGING

## 2022-04-14 MED ORDER — ALFUZOSIN HCL ER 10 MG PO TB24: 10.0000 mg | ORAL_TABLET | Freq: Every day | ORAL | 11 refills | Status: DC

## 2022-04-14 NOTE — Progress Notes (Signed)
Assessment: 1. BPH with obstruction/lower urinary tract symptoms   2. Incomplete bladder emptying     Plan: I personally reviewed the patient's chart including provider notes, and lab results. I do not appreciate any obvious abnormality on the patient's prostate exam and his PSA is within the normal range. Trial of alfuzosin 10 mg daily.  Rx sent.   Return to office in 1 month.  Chief Complaint:  Chief Complaint  Patient presents with   Prostate nodule    History of Present Illness:  James [name] is a 72 y.o. male who is seen in consultation from Colon Branch, MD for evaluation of abnormal prostate exam. He was found to have asymmetry to his prostate on physical exam in November 2023. PSA results: 8/18 2.85 11/22 3.18 11/23 3.41  No history of elevated PSA.  No history of UTIs or prostatitis.  No family history of prostate cancer. He ports lower urinary tract symptoms including frequency, urgency, and nocturia 2-3 times.  No dysuria or gross hematuria. IPSS = 9 today.  Past Medical History:  Past Medical History:  Diagnosis Date   No known problems     Past Surgical History:  Past Surgical History:  Procedure Laterality Date   COLONOSCOPY  2006   Neg, Dr.Gessner   VASECTOMY      Allergies:  No Known Allergies  Family History:  Family History  Problem Relation Age of Onset   Stroke Mother 60   Heart attack Father 39   Cancer Neg Hx    Diabetes Neg Hx    Colon cancer Neg Hx    Colon polyps Neg Hx     Social History:  Social History   Tobacco Use   Smoking status: Never   Smokeless tobacco: Never  Vaping Use   Vaping Use: Never used  Substance Use Topics   Alcohol use: Yes    Alcohol/week: 0.0 standard drinks of alcohol    Comment: 3 Beer/week   Drug use: No    Review of symptoms:  Constitutional:  Negative for unexplained weight loss, night sweats, fever, chills ENT:  Negative for nose bleeds, sinus pain, painful swallowing CV:  Negative  for chest pain, shortness of breath, exercise intolerance, palpitations, loss of consciousness Resp:  Negative for cough, wheezing, shortness of breath GI:  Negative for nausea, vomiting, diarrhea, bloody stools GU:  Positives noted in HPI; otherwise negative for gross hematuria, dysuria, urinary incontinence Neuro:  Negative for seizures, poor balance, limb weakness, slurred speech Psych:  Negative for lack of energy, depression, anxiety Endocrine:  Negative for polydipsia, polyuria, symptoms of hypoglycemia (dizziness, hunger, sweating) Hematologic:  Negative for anemia, purpura, petechia, prolonged or excessive bleeding, use of anticoagulants  Allergic:  Negative for difficulty breathing or choking as a result of exposure to anything; no shellfish allergy; no allergic response (rash/itch) to materials, foods  Physical exam: BP (!) 151/103   Pulse 74   Ht '6\' 2"'$  (1.88 m)   Wt 195 lb (88.5 kg)   BMI 25.04 kg/m  GENERAL APPEARANCE:  Well appearing, well developed, well nourished, NAD HEENT: Atraumatic, Normocephalic, oropharynx clear. NECK: Supple without lymphadenopathy or thyromegaly. LUNGS: Clear to auscultation bilaterally. HEART: Regular Rate and Rhythm without murmurs, gallops, or rubs. ABDOMEN: Soft, non-tender, No Masses. EXTREMITIES: Moves all extremities well.  Without clubbing, cyanosis, or edema. NEUROLOGIC:  Alert and oriented x 3, normal gait, CN II-XII grossly intact.  MENTAL STATUS:  Appropriate. BACK:  Non-tender to palpation.  No CVAT SKIN:  Warm, dry and intact.   GU: Penis:  circumcised Meatus: Normal Scrotum: normal, no masses Testis: normal without masses bilateral Epididymis: normal Prostate: 40 g, NT, no nodules or asymmetry Rectum: Normal tone,  no masses or tenderness  Results: U/A: dipstick negative  PVR:  204 ml

## 2022-05-18 ENCOUNTER — Ambulatory Visit: Payer: BC Managed Care – PPO | Admitting: Urology

## 2022-05-18 ENCOUNTER — Encounter: Payer: Self-pay | Admitting: *Deleted

## 2022-05-29 ENCOUNTER — Ambulatory Visit (INDEPENDENT_AMBULATORY_CARE_PROVIDER_SITE_OTHER): Payer: Medicare Other | Admitting: Urology

## 2022-05-29 ENCOUNTER — Encounter: Payer: Self-pay | Admitting: Urology

## 2022-05-29 VITALS — BP 149/87 | HR 70 | Ht 74.0 in | Wt 200.0 lb

## 2022-05-29 DIAGNOSIS — N138 Other obstructive and reflux uropathy: Secondary | ICD-10-CM

## 2022-05-29 DIAGNOSIS — N401 Enlarged prostate with lower urinary tract symptoms: Secondary | ICD-10-CM | POA: Diagnosis not present

## 2022-05-29 DIAGNOSIS — R339 Retention of urine, unspecified: Secondary | ICD-10-CM | POA: Diagnosis not present

## 2022-05-29 LAB — URINALYSIS, ROUTINE W REFLEX MICROSCOPIC
Bilirubin, UA: NEGATIVE
Glucose, UA: NEGATIVE
Ketones, UA: NEGATIVE
Leukocytes,UA: NEGATIVE
Nitrite, UA: NEGATIVE
Protein,UA: NEGATIVE
RBC, UA: NEGATIVE
Specific Gravity, UA: 1.01 (ref 1.005–1.030)
Urobilinogen, Ur: 0.2 mg/dL (ref 0.2–1.0)
pH, UA: 7 (ref 5.0–7.5)

## 2022-05-29 LAB — BLADDER SCAN AMB NON-IMAGING

## 2022-05-29 NOTE — Progress Notes (Signed)
   Assessment: 1. BPH with obstruction/lower urinary tract symptoms   2. Incomplete bladder emptying    Plan: Continue alfuzosin 10 mg daily.   Free and total PSA next visit.  Return to office in 6 weeks.  Chief Complaint:  Chief Complaint  Patient presents with   Benign Prostatic Hypertrophy    History of Present Illness:  James [name] is a 72 y.o. male who is seen for continued evaluation of BPH with obstruction and incomplete bladder emptying. He was found to have asymmetry to his prostate on physical exam in November 2023. PSA results: 8/18 2.85 11/22 3.18 11/23 3.41  No history of elevated PSA.  No history of UTIs or prostatitis.  No family history of prostate cancer. He ports lower urinary tract symptoms including frequency, urgency, and nocturia 2-3 times.  No dysuria or gross hematuria. IPSS = 9. PVR = 204 ml. DRE:  40 g gland without obvious nodularity or asymmetry.  He was started on alfuzosin 10 mg daily in 3/24.  He returns today for follow-up.  He continues on alfuzosin with improvement in his urinary symptoms.  He has noted a improved stream with decreased frequency, urgency, and nocturia.  No dysuria or gross hematuria.  No side effects from the medication. IPSS = 7 today.  Portions of the above documentation were copied from a prior visit for review purposes only.   Past Medical History:  Past Medical History:  Diagnosis Date   No known problems     Past Surgical History:  Past Surgical History:  Procedure Laterality Date   COLONOSCOPY  2006   Neg, Dr.Gessner   VASECTOMY      Allergies:  No Known Allergies  Family History:  Family History  Problem Relation Age of Onset   Stroke Mother 65   Heart attack Father 62   Cancer Neg Hx    Diabetes Neg Hx    Colon cancer Neg Hx    Colon polyps Neg Hx     Social History:  Social History   Tobacco Use   Smoking status: Never   Smokeless tobacco: Never  Vaping Use   Vaping Use: Never  used  Substance Use Topics   Alcohol use: Yes    Alcohol/week: 0.0 standard drinks of alcohol    Comment: 3 Beer/week   Drug use: No    ROS: Constitutional:  Negative for fever, chills, weight loss CV: Negative for chest pain, previous MI, hypertension Respiratory:  Negative for shortness of breath, wheezing, sleep apnea, frequent cough GI:  Negative for nausea, vomiting, bloody stool, GERD  Physical exam: BP (!) 149/87   Pulse 70   Ht 6\' 2"  (1.88 m)   Wt 200 lb (90.7 kg)   BMI 25.68 kg/m  GENERAL APPEARANCE:  Well appearing, well developed, well nourished, NAD HEENT:  Atraumatic, normocephalic, oropharynx clear NECK:  Supple without lymphadenopathy or thyromegaly ABDOMEN:  Soft, non-tender, no masses EXTREMITIES:  Moves all extremities well, without clubbing, cyanosis, or edema NEUROLOGIC:  Alert and oriented x 3, normal gait, CN II-XII grossly intact MENTAL STATUS:  appropriate BACK:  Non-tender to palpation, No CVAT SKIN:  Warm, dry, and intact  Results: U/A: negative  PVR:  291 ml

## 2022-06-05 ENCOUNTER — Encounter: Payer: Self-pay | Admitting: Gastroenterology

## 2022-07-02 ENCOUNTER — Telehealth: Payer: Self-pay | Admitting: Internal Medicine

## 2022-07-02 NOTE — Telephone Encounter (Signed)
Appt scheduled w/ Ramon Dredge tomorrow.

## 2022-07-02 NOTE — Telephone Encounter (Signed)
Initial Comment Caller states he has a growth above his testicles and states he does not feel good. Translation No Nurse Assessment Nurse: Gasper Sells, RN, Marylu Lund Date/Time Lamount Cohen Time): 07/02/2022 12:32:46 PM Confirm and document reason for call. If symptomatic, describe symptoms. ---Caller states he has a growth above his testicles and states he does not feel good.Left of the umbilical area. No fever. No pain to the touch. Does the patient have any new or worsening symptoms? ---Yes Will a triage be completed? ---Yes Related visit to physician within the last 2 weeks? ---No Does the PT have any chronic conditions? (i.e. diabetes, asthma, this includes High risk factors for pregnancy, etc.) ---Yes List chronic conditions. ---cholesterol med, bladder issues, Is this a behavioral health or substance abuse call? ---No Guidelines Guideline Title Affirmed Question Affirmed Notes Nurse Date/Time (Eastern Time) Hernia Can't reduce the hernia (NO pain, local tenderness, or vomiting) Quentin Cornwall 07/02/2022 12:34:43 PM Disp. Time Lamount Cohen Time) Disposition Final User 07/02/2022 12:31:16 PM Send to Urgent Wynona Canes 07/02/2022 12:36:05 PM See PCP within 24 Hours Yes Gasper Sells, RN, Marylu Lund PLEASE NOTE: All timestamps contained within this report are represented as [place] Standard Time. CONFIDENTIALTY NOTICE: This fax transmission is intended only for the addressee. It contains information that is legally privileged, confidential or otherwise protected from use or disclosure. If you are not the intended recipient, you are strictly prohibited from reviewing, disclosing, copying using or disseminating any of this information or taking any action in reliance on or regarding this information. If you have received this fax in error, please notify us immediately by telephone so that we can arrange for its return to [place]. Phone: [phone], Toll-Free: [phone], Fax:  [phone] Page: 2 of 2 Call Id: [id number] Final Disposition 07/02/2022 12:36:05 PM See PCP within 24 Hours Yes Gasper Sells, RN, Herbert Pun Disagree/Comply Comply Caller Understands Yes PreDisposition Call Doctor Care Advice Given Per Guideline SEE PCP WITHIN 24 HOURS: * IF OFFICE WILL BE OPEN: You need to be examined within the next 24 hours. Call your doctor (or NP/PA) when the office opens and make an appointment. CARE ADVICE given per Hernia (Adult) guideline AVOID STRAINING: * Avoid straining from constipation * Avoid straining from heavy lifting. CALL BACK IF: * Hernia becomes painful or tender to touch * Abdomen pain or vomiting occurs * You become worse Referrals REFERRED TO PCP OFFICE Warm transfer to backlinE

## 2022-07-02 NOTE — Telephone Encounter (Signed)
FYI: This call has been transferred to Access Nurse. Once the result note has been entered staff can address the message at that time.  Patient called in with the following symptoms:baseball size growth above testicle and he doesn't feel good all of a sudden. Growth noticed in the last hour   Red Word:growth above testicle   Please advise at Mobile [phone] (mobile)  Message is routed to Provider Pool and [facility] Triage

## 2022-07-03 ENCOUNTER — Encounter: Payer: Self-pay | Admitting: Family Medicine

## 2022-07-03 ENCOUNTER — Ambulatory Visit (INDEPENDENT_AMBULATORY_CARE_PROVIDER_SITE_OTHER): Payer: Medicare Other | Admitting: Family Medicine

## 2022-07-03 VITALS — BP 146/72 | HR 79 | Resp 18 | Ht 74.0 in | Wt 202.0 lb

## 2022-07-03 DIAGNOSIS — K4090 Unilateral inguinal hernia, without obstruction or gangrene, not specified as recurrent: Secondary | ICD-10-CM

## 2022-07-03 NOTE — Patient Instructions (Signed)
Send me a message if you change your mind and wish to see the general surgery team.   Let us know if you need anything.

## 2022-07-03 NOTE — Progress Notes (Signed)
Chief Complaint  Patient presents with   skin check    Possible hernia  Onset: 1 day    James [name] is a 72 y.o. male here for a skin complaint.  Duration: 1 day Location: L groin Pruritic? No Painful? No Drainage? No Was pulling on a pallet jack prior to this happening, unsure if it was the trigger. Other associated symptoms: no fevers, skin color changes, bruising, testicular pain, urinary complaints, bowel changes Therapies tried thus far: none  Past Medical History:  Diagnosis Date   No known problems     BP (!) 146/72   Pulse 79   Resp 18   Ht 6\' 2"  (1.88 m)   Wt 202 lb (91.6 kg)   SpO2 99%   BMI 25.94 kg/m  Gen: awake, alert, appearing stated age Lungs: No accessory muscle use Skin: Bulge in left inguinal region that enlarges upon Valsalva. No drainage, erythema, TTP, fluctuance, excoriation Psych: Age appropriate judgment and insight  Left inguinal hernia  Educated about inguinal hernias.  Offered referral but we decided to hold off for now.  If anything changes, he will send me a MyChart message and we will place referral to the general surgery team.  Ice, Tylenol as needed. F/u prn. The patient voiced understanding and agreement to the plan.  Jilda Roche [place], DO 07/03/22 4:09 PM

## 2022-07-08 ENCOUNTER — Other Ambulatory Visit: Payer: Self-pay

## 2022-07-08 ENCOUNTER — Encounter: Payer: Self-pay | Admitting: Internal Medicine

## 2022-07-08 DIAGNOSIS — K4090 Unilateral inguinal hernia, without obstruction or gangrene, not specified as recurrent: Secondary | ICD-10-CM

## 2022-07-08 MED ORDER — ATORVASTATIN CALCIUM 20 MG PO TABS: 20.0000 mg | ORAL_TABLET | Freq: Every day | ORAL | 1 refills | Status: DC

## 2022-07-10 ENCOUNTER — Ambulatory Visit (INDEPENDENT_AMBULATORY_CARE_PROVIDER_SITE_OTHER): Payer: Medicare Other | Admitting: Urology

## 2022-07-10 ENCOUNTER — Encounter: Payer: Self-pay | Admitting: Urology

## 2022-07-10 VITALS — BP 146/96 | HR 84 | Ht 74.0 in | Wt 202.0 lb

## 2022-07-10 DIAGNOSIS — R339 Retention of urine, unspecified: Secondary | ICD-10-CM | POA: Diagnosis not present

## 2022-07-10 DIAGNOSIS — N401 Enlarged prostate with lower urinary tract symptoms: Secondary | ICD-10-CM

## 2022-07-10 DIAGNOSIS — N138 Other obstructive and reflux uropathy: Secondary | ICD-10-CM

## 2022-07-10 LAB — URINALYSIS, ROUTINE W REFLEX MICROSCOPIC
Bilirubin, UA: NEGATIVE
Glucose, UA: NEGATIVE
Ketones, UA: NEGATIVE
Leukocytes,UA: NEGATIVE
Nitrite, UA: NEGATIVE
Protein,UA: NEGATIVE
RBC, UA: NEGATIVE
Specific Gravity, UA: 1.03 (ref 1.005–1.030)
Urobilinogen, Ur: 0.2 mg/dL (ref 0.2–1.0)
pH, UA: 6 (ref 5.0–7.5)

## 2022-07-10 LAB — BLADDER SCAN AMB NON-IMAGING

## 2022-07-10 MED ORDER — ALFUZOSIN HCL ER 10 MG PO TB24: 10.0000 mg | ORAL_TABLET | Freq: Every day | ORAL | 3 refills | Status: DC

## 2022-07-10 NOTE — Progress Notes (Signed)
Assessment: 1. BPH with obstruction/lower urinary tract symptoms   2. Incomplete bladder emptying     Plan: Continue alfuzosin 10 mg daily.  Rx sent to mail order pharmacy. Free and total PSA Return to office in 6 months  Chief Complaint:  Chief Complaint  Patient presents with   Benign Prostatic Hypertrophy    History of Present Illness:  James [name] is a 72 y.o. male who is seen for continued evaluation of BPH with obstruction and incomplete bladder emptying. He was found to have asymmetry to his prostate on physical exam in November 2023. PSA results: 8/18 2.85 11/22 3.18 11/23 3.41  No history of elevated PSA.  No history of UTIs or prostatitis.  No family history of prostate cancer. He ports lower urinary tract symptoms including frequency, urgency, and nocturia 2-3 times.  No dysuria or gross hematuria. IPSS = 9. PVR = 204 ml. DRE:  40 g gland without obvious nodularity or asymmetry.  He was started on alfuzosin 10 mg daily in 3/24. At his return visit in April 2024, he continued on alfuzosin with improvement in his urinary symptoms.  He noted a improved stream with decreased frequency, urgency, and nocturia.  No dysuria or gross hematuria.  No side effects from the medication. IPSS = 7. PVR = 291 ml  He returns today for scheduled follow-up.  He continues on alfuzosin 10 mg daily.  He has noted improvement in his urinary symptoms.  He feels like his stream is improved and he is emptying more completely.  No frequency or urgency.  He has nocturia x 1.  No dysuria or gross hematuria. IPSS = 2 today.  Portions of the above documentation were copied from a prior visit for review purposes only.   Past Medical History:  Past Medical History:  Diagnosis Date   No known problems     Past Surgical History:  Past Surgical History:  Procedure Laterality Date   COLONOSCOPY  2006   Neg, Dr.Gessner   VASECTOMY      Allergies:  No Known Allergies  Family  History:  Family History  Problem Relation Age of Onset   Stroke Mother 47   Heart attack Father 68   Cancer Neg Hx    Diabetes Neg Hx    Colon cancer Neg Hx    Colon polyps Neg Hx     Social History:  Social History   Tobacco Use   Smoking status: Never   Smokeless tobacco: Never  Vaping Use   Vaping Use: Never used  Substance Use Topics   Alcohol use: Yes    Alcohol/week: 0.0 standard drinks of alcohol    Comment: 3 Beer/week   Drug use: No    ROS: Constitutional:  Negative for fever, chills, weight loss CV: Negative for chest pain, previous MI, hypertension Respiratory:  Negative for shortness of breath, wheezing, sleep apnea, frequent cough GI:  Negative for nausea, vomiting, bloody stool, GERD  Physical exam: BP (!) 146/96   Pulse 84   Ht 6\' 2"  (1.88 m)   Wt 202 lb (91.6 kg)   BMI 25.94 kg/m  GENERAL APPEARANCE:  Well appearing, well developed, well nourished, NAD HEENT:  Atraumatic, normocephalic, oropharynx clear NECK:  Supple without lymphadenopathy or thyromegaly ABDOMEN:  Soft, non-tender, no masses EXTREMITIES:  Moves all extremities well, without clubbing, cyanosis, or edema NEUROLOGIC:  Alert and oriented x 3, normal gait, CN II-XII grossly intact MENTAL STATUS:  appropriate BACK:  Non-tender to palpation, No CVAT  SKIN:  Warm, dry, and intact  Results: U/A: Negative  PVR = 23 ml

## 2022-07-11 LAB — PSA, TOTAL AND FREE
PSA, Free Pct: 20.4 %
PSA, Free: 0.57 ng/mL
Prostate Specific Ag, Serum: 2.8 ng/mL (ref 0.0–4.0)

## 2022-07-13 ENCOUNTER — Encounter: Payer: Self-pay | Admitting: Urology

## 2022-07-13 DIAGNOSIS — K4090 Unilateral inguinal hernia, without obstruction or gangrene, not specified as recurrent: Secondary | ICD-10-CM | POA: Diagnosis not present

## 2022-08-31 ENCOUNTER — Telehealth: Payer: Self-pay | Admitting: Internal Medicine

## 2022-08-31 NOTE — Telephone Encounter (Signed)
Copied from CRM [id number]. Topic: Medicare AWV >> Aug 31, 2022  9:42 AM Payton Doughty wrote: Reason for CRM: LM 08/31/2022 to schedule AWV   Verlee Rossetti; Care Guide Ambulatory Clinical Support [place] [facility] Direct Dial: [phone]

## 2022-09-01 DIAGNOSIS — K4090 Unilateral inguinal hernia, without obstruction or gangrene, not specified as recurrent: Secondary | ICD-10-CM | POA: Diagnosis not present

## 2022-09-10 DIAGNOSIS — L84 Corns and callosities: Secondary | ICD-10-CM | POA: Diagnosis not present

## 2022-09-22 DIAGNOSIS — D225 Melanocytic nevi of trunk: Secondary | ICD-10-CM | POA: Diagnosis not present

## 2022-09-22 DIAGNOSIS — X32XXXS Exposure to sunlight, sequela: Secondary | ICD-10-CM | POA: Diagnosis not present

## 2022-09-22 DIAGNOSIS — L814 Other melanin hyperpigmentation: Secondary | ICD-10-CM | POA: Diagnosis not present

## 2022-09-22 DIAGNOSIS — L578 Other skin changes due to chronic exposure to nonionizing radiation: Secondary | ICD-10-CM | POA: Diagnosis not present

## 2022-09-22 DIAGNOSIS — L570 Actinic keratosis: Secondary | ICD-10-CM | POA: Diagnosis not present

## 2023-01-04 ENCOUNTER — Encounter: Payer: Self-pay | Admitting: Internal Medicine

## 2023-01-04 ENCOUNTER — Ambulatory Visit (INDEPENDENT_AMBULATORY_CARE_PROVIDER_SITE_OTHER): Payer: Medicare Other | Admitting: Internal Medicine

## 2023-01-04 VITALS — BP 126/84 | HR 71 | Temp 97.7°F | Resp 16 | Ht 74.0 in | Wt 210.5 lb

## 2023-01-04 DIAGNOSIS — E785 Hyperlipidemia, unspecified: Secondary | ICD-10-CM | POA: Diagnosis not present

## 2023-01-04 DIAGNOSIS — N138 Other obstructive and reflux uropathy: Secondary | ICD-10-CM

## 2023-01-04 DIAGNOSIS — N401 Enlarged prostate with lower urinary tract symptoms: Secondary | ICD-10-CM | POA: Diagnosis not present

## 2023-01-04 DIAGNOSIS — Z23 Encounter for immunization: Secondary | ICD-10-CM | POA: Diagnosis not present

## 2023-01-04 DIAGNOSIS — Z0000 Encounter for general adult medical examination without abnormal findings: Secondary | ICD-10-CM | POA: Diagnosis not present

## 2023-01-04 DIAGNOSIS — Z1211 Encounter for screening for malignant neoplasm of colon: Secondary | ICD-10-CM

## 2023-01-04 LAB — COMPREHENSIVE METABOLIC PANEL
ALT: 18 U/L (ref 0–53)
AST: 18 U/L (ref 0–37)
Albumin: 4.5 g/dL (ref 3.5–5.2)
Alkaline Phosphatase: 59 U/L (ref 39–117)
BUN: 13 mg/dL (ref 6–23)
CO2: 29 meq/L (ref 19–32)
Calcium: 9.5 mg/dL (ref 8.4–10.5)
Chloride: 103 meq/L (ref 96–112)
Creatinine, Ser: 1.06 mg/dL (ref 0.40–1.50)
GFR: 71.38 mL/min (ref >60.00)
Glucose, Bld: 90 mg/dL (ref 70–99)
Potassium: 4.8 meq/L (ref 3.5–5.1)
Sodium: 137 meq/L (ref 135–145)
Total Bilirubin: 1.1 mg/dL (ref 0.2–1.2)
Total Protein: 6.8 g/dL (ref 6.0–8.3)

## 2023-01-04 LAB — CBC WITH DIFFERENTIAL/PLATELET
Basophils Absolute: 0.1 10*3/uL (ref 0.0–0.1)
Basophils Relative: 1.2 % (ref 0.0–3.0)
Eosinophils Absolute: 0.1 10*3/uL (ref 0.0–0.7)
Eosinophils Relative: 1.7 % (ref 0.0–5.0)
HCT: 40 % (ref 39.0–52.0)
Hemoglobin: 13.5 g/dL (ref 13.0–17.0)
Lymphocytes Relative: 20.4 % (ref 12.0–46.0)
Lymphs Abs: 0.9 10*3/uL (ref 0.7–4.0)
MCHC: 33.8 g/dL (ref 30.0–36.0)
MCV: 82.4 fL (ref 78.0–100.0)
Monocytes Absolute: 0.4 10*3/uL (ref 0.1–1.0)
Monocytes Relative: 7.8 % (ref 3.0–12.0)
Neutro Abs: 3.1 10*3/uL (ref 1.4–7.7)
Neutrophils Relative %: 68.9 % (ref 43.0–77.0)
Platelets: 191 10*3/uL (ref 150.0–400.0)
RBC: 4.86 Mil/uL (ref 4.22–5.81)
RDW: 14 % (ref 11.5–15.5)
WBC: 4.5 10*3/uL (ref 4.0–10.5)

## 2023-01-04 LAB — LIPID PANEL
Cholesterol: 95 mg/dL (ref 0–200)
HDL: 48.6 mg/dL (ref >39.00)
LDL Cholesterol: 39 mg/dL (ref 0–99)
NonHDL: 46.45
Total CHOL/HDL Ratio: 2
Triglycerides: 38 mg/dL (ref 0.0–149.0)
VLDL: 7.6 mg/dL (ref 0.0–40.0)

## 2023-01-04 NOTE — Patient Instructions (Addendum)
Please reach out to gastroenterology and schedule your next colonoscopy.   [phone]  Vaccines I recommend: Covid booster     GO TO THE LAB : Get the blood work     Next visit with me in 1 year for a physical exam    Please schedule it at the front desk      "Health Care Power of attorney" ,  "Living will" (Advance care planning documents)  If you already have a living will or healthcare power of attorney, is recommended you bring the copy to be scanned in your chart.   The document will be available to all the doctors you see in the system.  Advance care planning is a process that supports adults in  understanding and sharing their preferences regarding future medical care.  The patient's preferences are recorded in documents called Advance Directives and the can be modified at any time while the patient is in full mental capacity.   If you don't have one, please consider create one.      More information at: [email or web address]

## 2023-01-04 NOTE — Progress Notes (Signed)
Subjective:    Patient ID: James [name], male    DOB: 09/25/1950, 72 y.o.   MRN: [id number]  DOS:  01/04/2023 Type of visit - description: cpx  Here for CPX. Doing well.   Review of Systems   A 14 point review of systems is negative    Past Medical History:  Diagnosis Date   No known problems     Past Surgical History:  Procedure Laterality Date   INGUINAL HERNIA REPAIR Left 2024   Dr Derrell Lolling   VASECTOMY     Social History   Socioeconomic History   Marital status: Married    Spouse name: Not on file   Number of children: 2   Years of education: Not on file   Highest education level: Associate degree: occupational, Scientist, product/process development, or vocational program  Occupational History   Occupation: [employer] , retired, Scientist, physiological    Comment: works part time  Tobacco Use   Smoking status: Never   Smokeless tobacco: Never  Vaping Use   Vaping status: Never Used  Substance and Sexual Activity   Alcohol use: Yes    Alcohol/week: 0.0 standard drinks of alcohol    Comment: 3 Beer/week   Drug use: No   Sexual activity: Not on file  Other Topics Concern   Not on file  Social History Narrative   Household= pt and wife   Social Determinants of Health   Financial Resource Strain: Low Risk  (07/02/2022)   Overall Financial Resource Strain (CARDIA)    Difficulty of Paying Living Expenses: Not hard at all  Food Insecurity: No Food Insecurity (07/02/2022)   Hunger Vital Sign    Worried About Running Out of Food in the Last Year: Never true    Ran Out of Food in the Last Year: Never true  Transportation Needs: No Transportation Needs (07/02/2022)   PRAPARE - Administrator, Civil Service (Medical): No    Lack of Transportation (Non-Medical): No  Physical Activity: Sufficiently Active (07/02/2022)   Exercise Vital Sign    Days of Exercise per Week: 3 days    Minutes of Exercise per Session: 50 min  Stress: No Stress Concern Present (07/02/2022)   [employer] of  Occupational Health - Occupational Stress Questionnaire    Feeling of Stress : Not at all  Social Connections: Socially Integrated (07/02/2022)   Social Connection and Isolation Panel [NHANES]    Frequency of Communication with Friends and Family: More than three times a week    Frequency of Social Gatherings with Friends and Family: More than three times a week    Attends Religious Services: More than 4 times per year    Active Member of [employer] or Organizations: Yes    Attends Engineer, structural: More than 4 times per year    Marital Status: Married  Catering manager Violence: Not At Risk (09/01/2021)   Humiliation, Afraid, Rape, and Kick questionnaire    Fear of Current or Ex-Partner: No    Emotionally Abused: No    Physically Abused: No    Sexually Abused: No    Current Outpatient Medications  Medication Instructions   alfuzosin (UROXATRAL) 10 mg, Oral, Daily   atorvastatin (LIPITOR) 20 mg, Oral, Daily at bedtime   Misc Natural Products (OSTEO BI-FLEX/5-LOXIN ADVANCED PO)    Omega-3 Fatty Acids (FISH OIL) 1000 MG CAPS Oral       Objective:   Physical Exam BP 126/84   Pulse 71   Temp 97.7  F (36.5 C) (Oral)   Resp 16   Ht 6\' 2"  (1.88 m)   Wt 210 lb 8 oz (95.5 kg)   SpO2 98%   BMI 27.03 kg/m  General: Well developed, NAD, BMI noted Neck: No  thyromegaly  HEENT:  Normocephalic . Face symmetric, atraumatic Lungs:  CTA B Normal respiratory effort, no intercostal retractions, no accessory muscle use. Heart: RRR,  no murmur.  Abdomen:  Not distended, soft, non-tender. No rebound or rigidity.   Lower extremities: no pretibial edema bilaterally  Skin: Exposed areas without rash. Not pale. Not jaundice Neurologic:  alert & oriented X3.  Speech normal, gait appropriate for age and unassisted Strength symmetric and appropriate for age.  Psych: Cognition and judgment appear intact.  Cooperative with normal attention span and concentration.  Behavior  appropriate. No anxious or depressed appearing.     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  PNM 20: 01/2023 Had a flu shot. Recommend: COVID booster  CCS: cscope 2006,Cscope 2017: polyps, next 05-2022 , plans to contact GI  Prostate ca screening: DRE last year was slightly asymmetric, saw urology, DRE negative.  Dx BPH, PSA is WNL. Labs: CBC CMP FLP Diet exercise: Doing well, exercises 3 times a week. Advance care planning: Discussed Hyperlipidemia: + FH CAD, on atorvastatin, checking labs BPH: Since last office visit, saw urology, diagnosed with BPH  RTC 1 year

## 2023-01-04 NOTE — Assessment & Plan Note (Signed)
Here for CPX Tdap 2017; S/p RSV; Shingrix x2 ;   PNM 23: 2020.  PNM 20: 01/2023 Had a flu shot. Recommend: COVID booster  CCS: cscope 2006,Cscope 2017: polyps, next 05-2022 , plans to contact GI  Prostate ca screening: DRE last year was slightly asymmetric, saw urology, DRE negative.  Dx BPH, PSA is WNL. Labs: CBC CMP FLP Diet exercise: Doing well, exercises 3 times a week. Advance care planning: Discussed

## 2023-01-04 NOTE — Assessment & Plan Note (Signed)
Here for CPX  Hyperlipidemia: + FH CAD, on atorvastatin, checking labs BPH: Since last office visit, saw urology, diagnosed with BPH  RTC 1 year

## 2023-01-08 ENCOUNTER — Encounter: Payer: Self-pay | Admitting: Urology

## 2023-01-08 ENCOUNTER — Ambulatory Visit (INDEPENDENT_AMBULATORY_CARE_PROVIDER_SITE_OTHER): Payer: Medicare Other | Admitting: Urology

## 2023-01-08 VITALS — BP 149/81 | HR 80 | Ht 74.0 in | Wt 210.0 lb

## 2023-01-08 DIAGNOSIS — N401 Enlarged prostate with lower urinary tract symptoms: Secondary | ICD-10-CM

## 2023-01-08 DIAGNOSIS — N138 Other obstructive and reflux uropathy: Secondary | ICD-10-CM | POA: Diagnosis not present

## 2023-01-08 LAB — URINALYSIS, ROUTINE W REFLEX MICROSCOPIC
Bilirubin, UA: NEGATIVE
Glucose, UA: NEGATIVE
Ketones, UA: NEGATIVE
Leukocytes,UA: NEGATIVE
Nitrite, UA: NEGATIVE
Protein,UA: NEGATIVE
RBC, UA: NEGATIVE
Specific Gravity, UA: 1.015 (ref 1.005–1.030)
Urobilinogen, Ur: 0.2 mg/dL (ref 0.2–1.0)
pH, UA: 6 (ref 5.0–7.5)

## 2023-01-08 NOTE — Progress Notes (Signed)
Assessment: 1. BPH with obstruction/lower urinary tract symptoms     Plan: Continue alfuzosin 10 mg daily.   Return to office in 6 months  Chief Complaint:  Chief Complaint  Patient presents with   Benign Prostatic Hypertrophy    History of Present Illness:  James [name] is a 72 y.o. male who is seen for continued evaluation of BPH with obstruction and incomplete bladder emptying. He was found to have asymmetry to his prostate on physical exam in November 2023. PSA results: 8/18 2.85 11/22 3.18 11/23 3.41  No history of elevated PSA.  No history of UTIs or prostatitis.  No family history of prostate cancer. He ports lower urinary tract symptoms including frequency, urgency, and nocturia 2-3 times.  No dysuria or gross hematuria. IPSS = 9. PVR = 204 ml. DRE:  40 g gland without obvious nodularity or asymmetry.  He was started on alfuzosin 10 mg daily in 3/24. At his return visit in April 2024, he continued on alfuzosin with improvement in his urinary symptoms.  He noted a improved stream with decreased frequency, urgency, and nocturia.  No dysuria or gross hematuria.  No side effects from the medication. IPSS = 7. PVR = 291 ml  At his visit in 6/24, he continued on alfuzosin 10 mg daily.  He had noted improvement in his urinary symptoms with improved force of stream and more complete emptying.  No frequency or urgency.  He reported nocturia x 1.  No dysuria or gross hematuria. IPSS = 2. PVR = 23 ml PSA 6/24:  2.8 with 20.4% free.  He returns today for follow-up.  He continues on alfuzosin.  His urinary symptoms are well-controlled.  He has occasional frequency, urgency, and nocturia x 1.  No dysuria or gross hematuria.  He is very happy with his current voiding pattern. IPSS = 3 QOL = 1 today.   Portions of the above documentation were copied from a prior visit for review purposes only.   Past Medical History:  Past Medical History:  Diagnosis Date   No known  problems     Past Surgical History:  Past Surgical History:  Procedure Laterality Date   INGUINAL HERNIA REPAIR Left 2024   Dr Derrell Lolling   VASECTOMY      Allergies:  Allergies  Allergen Reactions   Nickel Rash    It causes rash on his skin if worn for a long period of time    Family History:  Family History  Problem Relation Age of Onset   Stroke Mother 21   Heart attack Father 55   Cancer Neg Hx    Diabetes Neg Hx    Colon cancer Neg Hx    Colon polyps Neg Hx     Social History:  Social History   Tobacco Use   Smoking status: Never   Smokeless tobacco: Never  Vaping Use   Vaping status: Never Used  Substance Use Topics   Alcohol use: Yes    Alcohol/week: 0.0 standard drinks of alcohol    Comment: 3 Beer/week   Drug use: No    ROS: Constitutional:  Negative for fever, chills, weight loss CV: Negative for chest pain, previous MI, hypertension Respiratory:  Negative for shortness of breath, wheezing, sleep apnea, frequent cough GI:  Negative for nausea, vomiting, bloody stool, GERD  Physical exam: BP (!) 149/81   Pulse 80   Ht 6\' 2"  (1.88 m)   Wt 210 lb (95.3 kg)   BMI 26.96 kg/m  GENERAL APPEARANCE:  Well appearing, well developed, well nourished, NAD HEENT:  Atraumatic, normocephalic, oropharynx clear NECK:  Supple without lymphadenopathy or thyromegaly ABDOMEN:  Soft, non-tender, no masses EXTREMITIES:  Moves all extremities well, without clubbing, cyanosis, or edema NEUROLOGIC:  Alert and oriented x 3, normal gait, CN II-XII grossly intact MENTAL STATUS:  appropriate BACK:  Non-tender to palpation, No CVAT SKIN:  Warm, dry, and intact  Results: U/A: Negative

## 2023-01-13 ENCOUNTER — Encounter: Payer: Self-pay | Admitting: Gastroenterology

## 2023-03-01 ENCOUNTER — Ambulatory Visit (AMBULATORY_SURGERY_CENTER): Payer: Medicare PPO

## 2023-03-01 VITALS — Ht 74.0 in | Wt 205.0 lb

## 2023-03-01 DIAGNOSIS — Z860100 Personal history of colon polyps, unspecified: Secondary | ICD-10-CM

## 2023-03-01 MED ORDER — SUFLAVE 178.7 G PO SOLR: 1.0000 | Freq: Once | ORAL | 0 refills | Status: AC

## 2023-03-01 NOTE — Progress Notes (Signed)
No egg or soy allergy known to patient  No issues known to pt with past sedation with any surgeries or procedures Patient denies ever being told they had issues or difficulty with intubation  No FH of Malignant Hyperthermia Pt is not on diet pills Pt states not taking GLP medications Pt is not on  home 02  Pt is not on blood thinners  Pt denies issues with constipation  No A fib or A flutter Have any cardiac testing pending--no Pt instructed to use Singlecare.com or GoodRx for a price reduction on prep  Ambulates independently Patient's chart reviewed by Cathlyn Parsons CNRA prior to previsit and patient appropriate for the [facility].  Previsit completed and red dot placed by patient's name on their procedure day (on provider's schedule).

## 2023-03-09 ENCOUNTER — Encounter: Payer: Self-pay | Admitting: Gastroenterology

## 2023-03-12 ENCOUNTER — Encounter: Payer: Self-pay | Admitting: Internal Medicine

## 2023-03-12 ENCOUNTER — Ambulatory Visit (AMBULATORY_SURGERY_CENTER): Payer: Medicare Other | Admitting: Gastroenterology

## 2023-03-12 ENCOUNTER — Encounter: Payer: Self-pay | Admitting: Gastroenterology

## 2023-03-12 VITALS — BP 132/77 | HR 67 | Temp 98.6°F | Resp 17 | Ht 74.0 in | Wt 205.0 lb

## 2023-03-12 DIAGNOSIS — K5730 Diverticulosis of large intestine without perforation or abscess without bleeding: Secondary | ICD-10-CM

## 2023-03-12 DIAGNOSIS — D120 Benign neoplasm of cecum: Secondary | ICD-10-CM

## 2023-03-12 DIAGNOSIS — Z860100 Personal history of colon polyps, unspecified: Secondary | ICD-10-CM

## 2023-03-12 DIAGNOSIS — Z1211 Encounter for screening for malignant neoplasm of colon: Secondary | ICD-10-CM | POA: Diagnosis not present

## 2023-03-12 DIAGNOSIS — D175 Benign lipomatous neoplasm of intra-abdominal organs: Secondary | ICD-10-CM | POA: Diagnosis not present

## 2023-03-12 DIAGNOSIS — D125 Benign neoplasm of sigmoid colon: Secondary | ICD-10-CM | POA: Diagnosis not present

## 2023-03-12 MED ORDER — ATORVASTATIN CALCIUM 20 MG PO TABS: 20.0000 mg | ORAL_TABLET | Freq: Every day | ORAL | 1 refills | Status: DC

## 2023-03-12 MED ORDER — SODIUM CHLORIDE 0.9 % IV SOLN: 500.0000 mL | Freq: Once | INTRAVENOUS | Status: DC

## 2023-03-12 NOTE — Patient Instructions (Addendum)
 Resume previous diet.                           - Continue present medications.                           - Await pathology results.                           - Repeat colonoscopy date to be determined after                            pending pathology results are reviewed for                            surveillance based on pathology results.  Handout on polyps and diverticulosis given    YOU HAD AN ENDOSCOPIC PROCEDURE TODAY AT THE [facility]:   Refer to the procedure report that was given to you for any specific questions about what was found during the examination.  If the procedure report does not answer your questions, please call your gastroenterologist to clarify.  If you requested that your care partner not be given the details of your procedure findings, then the procedure report has been included in a sealed envelope for you to review at your convenience later.  YOU SHOULD EXPECT: Some feelings of bloating in the abdomen. Passage of more gas than usual.  Walking can help get rid of the air that was put into your GI tract during the procedure and reduce the bloating. If you had a lower endoscopy (such as a colonoscopy or flexible sigmoidoscopy) you may notice spotting of blood in your stool or on the toilet paper. If you underwent a bowel prep for your procedure, you may not have a normal bowel movement for a few days.  Please Note:  You might notice some irritation and congestion in your nose or some drainage.  This is from the oxygen used during your procedure.  There is no need for concern and it should clear up in a day or so.  SYMPTOMS TO REPORT IMMEDIATELY:  Following lower endoscopy (colonoscopy or flexible sigmoidoscopy):  Excessive amounts of blood in the stool  Significant tenderness or worsening of abdominal pains  Swelling of the abdomen that is new, acute  Fever of 100F or higher   For urgent or emergent issues, a gastroenterologist can be reached at  any hour by calling [phone]. Do not use MyChart messaging for urgent concerns.    DIET:  We do recommend a small meal at first, but then you may proceed to your regular diet.  Drink plenty of fluids but you should avoid alcoholic beverages for 24 hours.  ACTIVITY:  You should plan to take it easy for the rest of today and you should NOT DRIVE or use heavy machinery until tomorrow (because of the sedation medicines used during the test).    FOLLOW UP: Our staff will call the number listed on your records the next business day following your procedure.  We will call around 7:15- 8:00 am to check on you and address any questions or concerns that you may have regarding the information given to you following your procedure. If we do not reach you, we will leave a message.  If any biopsies were taken you will be contacted by phone or by letter within the next 1-3 weeks.  Please call us  at [phone] if you have not heard about the biopsies in 3 weeks.    SIGNATURES/CONFIDENTIALITY: You and/or your care partner have signed paperwork which will be entered into your electronic medical record.  These signatures attest to the fact that that the information above on your After Visit Summary has been reviewed and is understood.  Full responsibility of the confidentiality of this discharge information lies with you and/or your care-partner.

## 2023-03-12 NOTE — Progress Notes (Signed)
 To pacu, VSS. Report to RN.tb

## 2023-03-12 NOTE — Progress Notes (Signed)
 Called to room to assist during endoscopic procedure.  Patient ID and intended procedure confirmed with present staff. Received instructions for my participation in the procedure from the performing physician.

## 2023-03-12 NOTE — Progress Notes (Signed)
 Pt's states no medical or surgical changes since previsit or office visit.

## 2023-03-12 NOTE — Progress Notes (Signed)
 [facility] History and Physical   Primary Care Physician:  Amon Aloysius BRAVO, MD   Reason for Procedure:   Colon cancer screening/history of colon polyps  Plan:    Surveillance colonoscopy     HPI: James [name] is a 73 y.o. male undergoing surveillance colonoscopy.  He has no family history of colon cancer and no chronic GI symptoms.  He underwent a colonoscopy in 2017 by Dr. Teressa in which 2 small polyps were removed, one of which was a tubular adenoma.   Past Medical History:  Diagnosis Date   No known problems     Past Surgical History:  Procedure Laterality Date   COLONOSCOPY     INGUINAL HERNIA REPAIR Left 2024   Dr Rubin   VASECTOMY      Prior to Admission medications   Medication Sig Start Date End Date Taking? Authorizing Provider  atorvastatin  (LIPITOR) 20 MG tablet Take 1 tablet (20 mg total) by mouth at bedtime. 07/08/22  Yes Paz, Jose E, MD  alfuzosin  (UROXATRAL ) 10 MG 24 hr tablet Take 1 tablet (10 mg total) by mouth daily. 07/10/22   Stoneking, Adine PARAS., MD  Misc Natural Products (OSTEO BI-FLEX/5-LOXIN ADVANCED PO)  04/23/12   [provider]  Omega-3 Fatty Acids (FISH OIL) 1000 MG CAPS Take by mouth.    [provider]    Current Outpatient Medications  Medication Sig Dispense Refill   atorvastatin  (LIPITOR) 20 MG tablet Take 1 tablet (20 mg total) by mouth at bedtime. 90 tablet 1   alfuzosin  (UROXATRAL ) 10 MG 24 hr tablet Take 1 tablet (10 mg total) by mouth daily. 90 tablet 3   Misc Natural Products (OSTEO BI-FLEX/5-LOXIN ADVANCED PO)      Omega-3 Fatty Acids (FISH OIL) 1000 MG CAPS Take by mouth.     Current Facility-Administered Medications  Medication Dose Route Frequency Provider Last Rate Last Admin   0.9 %  sodium chloride  infusion  500 mL Intravenous Once Stacia Glendia BRAVO, MD        Allergies as of 03/12/2023 - Review Complete 03/12/2023  Allergen Reaction Noted   Nickel Rash 08/26/2022    Family History   Problem Relation Age of Onset   Stroke Mother 65   Heart attack Father 31   Cancer Neg Hx    Diabetes Neg Hx    Colon cancer Neg Hx    Colon polyps Neg Hx    Rectal cancer Neg Hx    Stomach cancer Neg Hx     Social History   Socioeconomic History   Marital status: Married    Spouse name: Not on file   Number of children: 2   Years of education: Not on file   Highest education level: Associate degree: occupational, scientist, product/process development, or vocational program  Occupational History   Occupation: [employer] , retired, scientist, physiological    Comment: works part time  Tobacco Use   Smoking status: Never   Smokeless tobacco: Never  Vaping Use   Vaping status: Never Used  Substance and Sexual Activity   Alcohol use: Yes    Alcohol/week: 3.0 standard drinks of alcohol    Types: 3 Cans of beer per week    Comment: 3 Beer/week   Drug use: No   Sexual activity: Not on file  Other Topics Concern   Not on file  Social History Narrative   Household= pt and wife   Social Drivers of Health   Financial Resource Strain: Low Risk  (07/02/2022)  Overall Financial Resource Strain (CARDIA)    Difficulty of Paying Living Expenses: Not hard at all  Food Insecurity: No Food Insecurity (07/02/2022)   Hunger Vital Sign    Worried About Running Out of Food in the Last Year: Never true    Ran Out of Food in the Last Year: Never true  Transportation Needs: No Transportation Needs (07/02/2022)   PRAPARE - Administrator, Civil Service (Medical): No    Lack of Transportation (Non-Medical): No  Physical Activity: Sufficiently Active (07/02/2022)   Exercise Vital Sign    Days of Exercise per Week: 3 days    Minutes of Exercise per Session: 50 min  Stress: No Stress Concern Present (07/02/2022)   [employer] of Occupational Health - Occupational Stress Questionnaire    Feeling of Stress : Not at all  Social Connections: Socially Integrated (07/02/2022)   Social Connection and Isolation Panel [NHANES]     Frequency of Communication with Friends and Family: More than three times a week    Frequency of Social Gatherings with Friends and Family: More than three times a week    Attends Religious Services: More than 4 times per year    Active Member of [employer] or Organizations: Yes    Attends Engineer, Structural: More than 4 times per year    Marital Status: Married  Catering Manager Violence: Not At Risk (09/01/2021)   Humiliation, Afraid, Rape, and Kick questionnaire    Fear of Current or Ex-Partner: No    Emotionally Abused: No    Physically Abused: No    Sexually Abused: No    Review of Systems:  All other review of systems negative except as mentioned in the HPI.  Physical Exam: Vital signs BP (!) 160/87   Pulse 75   Temp 98.6 F (37 C) (Temporal)   Ht 6' 2 (1.88 m)   Wt 205 lb (93 kg)   SpO2 97%   BMI 26.32 kg/m   General:   Alert,  Well-developed, well-nourished, pleasant and cooperative in NAD Airway:  Mallampati 2 Lungs:  Clear throughout to auscultation.   Heart:  Regular rate and rhythm; no murmurs, clicks, rubs,  or gallops. Abdomen:  Soft, nontender and nondistended. Normal bowel sounds.   Neuro/Psych:  Normal mood and affect. A and O x 3   Malerie Eakins E. Stacia, MD [facility]

## 2023-03-12 NOTE — Op Note (Signed)
 [facility] Patient Name: James [name] Procedure Date: 03/12/2023 7:10 AM MRN: [id number] Endoscopist: Glendia E. Stacia , MD, [id number] Age: 73 Referring MD:  Date of Birth: [birth year] Gender: Male Account #: [id number] Procedure:                Colonoscopy Indications:              Surveillance: Personal history of adenomatous                            polyps on last colonoscopy > 5 years ago, Last                            colonoscopy: April 2017 Medicines:                Monitored Anesthesia Care Procedure:                Pre-Anesthesia Assessment:                           - Prior to the procedure, a History and Physical                            was performed, and patient medications and                            allergies were reviewed. The patient's tolerance of                            previous anesthesia was also reviewed. The risks                            and benefits of the procedure and the sedation                            options and risks were discussed with the patient.                            All questions were answered, and informed consent                            was obtained. Prior Anticoagulants: The patient has                            taken no anticoagulant or antiplatelet agents. ASA                            Grade Assessment: II - A patient with mild systemic                            disease. After reviewing the risks and benefits,                            the patient was deemed in satisfactory condition to  undergo the procedure.                           After obtaining informed consent, the colonoscope                            was passed under direct vision. Throughout the                            procedure, the patient's blood pressure, pulse, and                            oxygen saturations were monitored continuously. The                            Olympus Scope SN: [id number] was  introduced through                            the anus and advanced to the the terminal ileum,                            with identification of the appendiceal orifice and                            IC valve. The colonoscopy was performed without                            difficulty. The patient tolerated the procedure                            well. The quality of the bowel preparation was                            adequate. The terminal ileum, ileocecal valve,                            appendiceal orifice, and rectum were photographed.                            The bowel preparation used was Suflave  via split                            dose instruction. Scope In: 8:01:20 AM Scope Out: 8:21:25 AM Scope Withdrawal Time: 0 hours 13 minutes 33 seconds  Total Procedure Duration: 0 hours 20 minutes 5 seconds  Findings:                 The perianal and digital rectal examinations were                            normal. Pertinent negatives include normal                            sphincter tone and no palpable rectal lesions.  A 12 mm polyp was found in the cecum. The polyp was                            sessile. The polyp was removed with a cold snare.                            Resection and retrieval were complete. Estimated                            blood loss was minimal.                           A 4 mm polyp was found in the sigmoid colon. The                            polyp was sessile. The polyp was removed with a                            cold snare. Resection and retrieval were complete.                            Estimated blood loss was minimal.                           A few small-mouthed diverticula were found in the                            sigmoid colon, descending colon and ascending colon.                           There was a medium-sized lipoma, 20 mm in diameter,                            in the sigmoid colon.                            The exam was otherwise normal throughout the                            examined colon.                           The terminal ileum appeared normal.                           The retroflexed view of the distal rectum and anal                            verge was normal and showed no anal or rectal                            abnormalities. Complications:            No immediate complications. Estimated Blood Loss:  Estimated blood loss was minimal. Impression:               - One 12 mm polyp in the cecum, removed with a cold                            snare. Resected and retrieved.                           - One 4 mm polyp in the sigmoid colon, removed with                            a cold snare. Resected and retrieved.                           - Mild diverticulosis in the sigmoid colon, in the                            descending colon and in the ascending colon.                           - Medium-sized lipoma in the sigmoid colon.                           - The examined portion of the ileum was normal.                           - The distal rectum and anal verge are normal on                            retroflexion view. Recommendation:           - Patient has a contact number available for                            emergencies. The signs and symptoms of potential                            delayed complications were discussed with the                            patient. Return to normal activities tomorrow.                            Written discharge instructions were provided to the                            patient.                           - Resume previous diet.                           - Continue present medications.                           -  Await pathology results.                           - Repeat colonoscopy date to be determined after                            pending pathology results are reviewed for                            surveillance based on  pathology results. Gilberta Peeters E. Stacia, MD 03/12/2023 8:32:27 AM This report has been signed electronically.

## 2023-03-15 ENCOUNTER — Telehealth: Payer: Self-pay

## 2023-03-15 NOTE — Telephone Encounter (Signed)
  Follow up Call-     03/12/2023    7:17 AM  Call back number  Post procedure Call Back phone  # [phone]  Permission to leave phone message Yes     Patient questions:  Do you have a fever, pain , or abdominal swelling? No. Pain Score  0 *  Have you tolerated food without any problems? Yes.    Have you been able to return to your normal activities? Yes.    Do you have any questions about your discharge instructions: Diet   No. Medications  No. Follow up visit  No.  Do you have questions or concerns about your Care? No.  Actions: * If pain score is 4 or above: No action needed, pain <4.

## 2023-03-16 LAB — SURGICAL PATHOLOGY

## 2023-03-17 ENCOUNTER — Encounter: Payer: Self-pay | Admitting: Gastroenterology

## 2023-03-17 NOTE — Progress Notes (Signed)
 Mr. James [name],   The two polyps that I removed during your recent procedure were completely benign but were proven to be pre-cancerous polyps that MAY have grown into cancers if they had not been removed.  Studies shows that at least 20% of women over age 71 and 30% of men over age 32 have pre-cancerous polyps.  Based on current nationally recognized surveillance guidelines, I recommend that you have a repeat colonoscopy in 3 years.   If you develop any new rectal bleeding, abdominal pain or significant bowel habit changes, please contact me before then.

## 2023-04-20 ENCOUNTER — Ambulatory Visit (INDEPENDENT_AMBULATORY_CARE_PROVIDER_SITE_OTHER): Admitting: Internal Medicine

## 2023-04-20 VITALS — BP 136/80 | HR 82 | Temp 97.7°F | Resp 16 | Ht 74.0 in | Wt 213.0 lb

## 2023-04-20 DIAGNOSIS — M25551 Pain in right hip: Secondary | ICD-10-CM

## 2023-04-20 NOTE — Patient Instructions (Signed)
 Tylenol  500 mg OTC 2 tabs a day every 8 hours as needed for pain   IBUPROFEN (Advil or Motrin) 200 mg 2 tablets every 8 hours as needed for pain.  Always take it with food because may cause gastritis and ulcers.  If you notice nausea, stomach pain, change in the color of stools --->  Stop the medicine and let us know

## 2023-04-20 NOTE — Progress Notes (Unsigned)
   Subjective:    Patient ID: James [name], male    DOB: 1950-06-23, 73 y.o.   MRN: [id number]  DOS:  04/20/2023 Type of visit - description: Acute  For the last few months, he has developed pain at the right buttock. Pain is worse when he stands or when he stands up after sitting for an hour or so. Ibuprofen helps. Denies any numbness on the lower extremities. No injury that he recalls.  Review of Systems See above   Past Medical History:  Diagnosis Date   No known problems     Past Surgical History:  Procedure Laterality Date   COLONOSCOPY     INGUINAL HERNIA REPAIR Left 2024   Dr Angela Cox      Current Outpatient Medications  Medication Instructions   alfuzosin (UROXATRAL) 10 mg, Oral, Daily   atorvastatin (LIPITOR) 20 mg, Oral, Daily at bedtime   Misc Natural Products (OSTEO BI-FLEX/5-LOXIN ADVANCED PO)    Omega-3 Fatty Acids (FISH OIL) 1000 MG CAPS Take by mouth.       Objective:   Physical Exam BP 136/80   Pulse 82   Temp 97.7 F (36.5 C) (Oral)   Resp 16   Ht 6\' 2"  (1.88 m)   Wt 213 lb (96.6 kg)   SpO2 96%   BMI 27.35 kg/m  General:   Well developed, NAD, BMI noted. HEENT:  Normocephalic . Face symmetric, atraumatic MSK: Number spine: No TTP SI joints: No TTP Trochanteric bursas no TTP Hip rotation bilaterally normal range, no pain. Lower extremities: no pretibial edema bilaterally  Skin: Not pale. Not jaundice Neurologic:  alert & oriented X3.  Speech normal, gait appropriate for age and unassisted.  Motor and DTR symmetric Psych--  Cognition and judgment appear intact.  Cooperative with normal attention span and concentration.  Behavior appropriate. No anxious or depressed appearing.      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  At times the pain is at  least moderate and is limiting some activities. Plan: Judicious use of Tylenol, ibuprofen with GI precautions, recommend to see orthopedics to get established. He verbalized understanding. Referral placed

## 2023-04-21 NOTE — Assessment & Plan Note (Signed)
 Right hip pain: 3 to 4 months history of right hip pain as described above, physical exam is benign.  At times the pain is at least moderate and is limiting some activities. Plan: Judicious use of Tylenol, ibuprofen with GI precautions, recommend to see orthopedics to get established. He verbalized understanding. Referral placed

## 2023-04-27 DIAGNOSIS — M25551 Pain in right hip: Secondary | ICD-10-CM | POA: Diagnosis not present

## 2023-06-15 ENCOUNTER — Other Ambulatory Visit: Payer: Self-pay | Admitting: Urology

## 2023-06-15 DIAGNOSIS — N138 Other obstructive and reflux uropathy: Secondary | ICD-10-CM

## 2023-06-15 DIAGNOSIS — R339 Retention of urine, unspecified: Secondary | ICD-10-CM

## 2023-06-15 MED ORDER — ALFUZOSIN HCL ER 10 MG PO TB24: 10.0000 mg | ORAL_TABLET | Freq: Every day | ORAL | 3 refills | Status: AC

## 2023-07-08 ENCOUNTER — Encounter: Payer: Self-pay | Admitting: Urology

## 2023-07-08 ENCOUNTER — Ambulatory Visit: Payer: BC Managed Care – PPO | Admitting: Urology

## 2023-07-08 VITALS — BP 159/83 | HR 75 | Ht 74.0 in | Wt 205.0 lb

## 2023-07-08 DIAGNOSIS — N138 Other obstructive and reflux uropathy: Secondary | ICD-10-CM | POA: Diagnosis not present

## 2023-07-08 DIAGNOSIS — N401 Enlarged prostate with lower urinary tract symptoms: Secondary | ICD-10-CM

## 2023-07-08 LAB — URINALYSIS, ROUTINE W REFLEX MICROSCOPIC
Bilirubin, UA: NEGATIVE
Glucose, UA: NEGATIVE
Ketones, UA: NEGATIVE
Leukocytes,UA: NEGATIVE
Nitrite, UA: NEGATIVE
Protein,UA: NEGATIVE
RBC, UA: NEGATIVE
Specific Gravity, UA: 1.02 (ref 1.005–1.030)
Urobilinogen, Ur: 0.2 mg/dL (ref 0.2–1.0)
pH, UA: 6 (ref 5.0–7.5)

## 2023-07-08 NOTE — Progress Notes (Signed)
 Assessment: 1. BPH with obstruction/lower urinary tract symptoms     Plan: Continue alfuzosin  10 mg daily.   PSA today Return to office in 1 year  Chief Complaint:  Chief Complaint  Patient presents with   Benign Prostatic Hypertrophy    History of Present Illness:  James [name] is a 73 y.o. male who is seen for continued evaluation of BPH with obstruction and incomplete bladder emptying. He was found to have asymmetry to his prostate on physical exam in November 2023. PSA results: 8/18 2.85 11/22 3.18 11/23 3.41  No history of elevated PSA.  No history of UTIs or prostatitis.  No family history of prostate cancer. He ports lower urinary tract symptoms including frequency, urgency, and nocturia 2-3 times.  No dysuria or gross hematuria. IPSS = 9. PVR = 204 ml. DRE:  40 g gland without obvious nodularity or asymmetry.  He was started on alfuzosin  10 mg daily in 3/24. At his return visit in April 2024, he continued on alfuzosin  with improvement in his urinary symptoms.  He noted a improved stream with decreased frequency, urgency, and nocturia.  No dysuria or gross hematuria.  No side effects from the medication. IPSS = 7. PVR = 291 ml  At his visit in 6/24, he continued on alfuzosin  10 mg daily.  He had noted improvement in his urinary symptoms with improved force of stream and more complete emptying.  No frequency or urgency.  He reported nocturia x 1.  No dysuria or gross hematuria. IPSS = 2. PVR = 23 ml PSA 6/24:  2.8 with 20.4% free.  At the time of his visit in 12/24, he was doing well.  He continued on alfuzosin  with good control of his lower urinary tract symptoms.  He did have occasional frequency, urgency, and nocturia x 1.  He is very pleased with his voiding pattern. IPSS = 3/1  He returns today for follow-up.  He continues on alfuzosin  with good control of his urinary symptoms.  No dysuria or gross hematuria. IPSS = 4/1.   Portions of the above  documentation were copied from a prior visit for review purposes only.   Past Medical History:  Past Medical History:  Diagnosis Date   No known problems     Past Surgical History:  Past Surgical History:  Procedure Laterality Date   COLONOSCOPY     INGUINAL HERNIA REPAIR Left 2024   Dr Melton Squires   VASECTOMY      Allergies:  Allergies  Allergen Reactions   Nickel Rash    It causes rash on his skin if worn for a long period of time    Family History:  Family History  Problem Relation Age of Onset   Stroke Mother 86   Heart attack Father 78   Cancer Neg Hx    Diabetes Neg Hx    Colon cancer Neg Hx    Colon polyps Neg Hx    Rectal cancer Neg Hx    Stomach cancer Neg Hx     Social History:  Social History   Tobacco Use   Smoking status: Never   Smokeless tobacco: Never  Vaping Use   Vaping status: Never Used  Substance Use Topics   Alcohol use: Yes    Alcohol/week: 3.0 standard drinks of alcohol    Types: 3 Cans of beer per week    Comment: 3 Beer/week   Drug use: No    ROS: Constitutional:  Negative for fever, chills, weight loss  CV: Negative for chest pain, previous MI, hypertension Respiratory:  Negative for shortness of breath, wheezing, sleep apnea, frequent cough GI:  Negative for nausea, vomiting, bloody stool, GERD  Physical exam: BP (!) 159/83   Pulse 75   Ht 6\' 2"  (1.88 m)   Wt 205 lb (93 kg)   BMI 26.32 kg/m  GENERAL APPEARANCE:  Well appearing, well developed, well nourished, NAD HEENT:  Atraumatic, normocephalic, oropharynx clear NECK:  Supple without lymphadenopathy or thyromegaly ABDOMEN:  Soft, non-tender, no masses EXTREMITIES:  Moves all extremities well, without clubbing, cyanosis, or edema NEUROLOGIC:  Alert and oriented x 3, normal gait, CN II-XII grossly intact MENTAL STATUS:  appropriate BACK:  Non-tender to palpation, No CVAT SKIN:  Warm, dry, and intact GU: Prostate: 40 g, NT, no nodules Rectum: Normal tone,  no masses  or tenderness   Results: U/A: Negative

## 2023-07-12 LAB — PSA: Prostate Specific Ag, Serum: 3.2 ng/mL (ref 0.0–4.0)

## 2023-07-13 ENCOUNTER — Ambulatory Visit: Payer: Self-pay | Admitting: Urology

## 2023-09-04 ENCOUNTER — Other Ambulatory Visit: Payer: Self-pay | Admitting: Internal Medicine

## 2024-01-11 ENCOUNTER — Encounter: Payer: BC Managed Care – PPO | Admitting: Internal Medicine

## 2024-02-09 ENCOUNTER — Emergency Department (HOSPITAL_BASED_OUTPATIENT_CLINIC_OR_DEPARTMENT_OTHER)

## 2024-02-09 ENCOUNTER — Ambulatory Visit: Payer: Self-pay

## 2024-02-09 ENCOUNTER — Emergency Department (HOSPITAL_BASED_OUTPATIENT_CLINIC_OR_DEPARTMENT_OTHER)
Admission: EM | Admit: 2024-02-09 | Discharge: 2024-02-09 | Disposition: A | Discharge status: Home / Self Care | Attending: Emergency Medicine | Admitting: Emergency Medicine

## 2024-02-09 ENCOUNTER — Other Ambulatory Visit: Payer: Self-pay

## 2024-02-09 ENCOUNTER — Encounter (HOSPITAL_BASED_OUTPATIENT_CLINIC_OR_DEPARTMENT_OTHER): Payer: Self-pay

## 2024-02-09 DIAGNOSIS — R911 Solitary pulmonary nodule: Secondary | ICD-10-CM | POA: Diagnosis not present

## 2024-02-09 DIAGNOSIS — Y9301 Activity, walking, marching and hiking: Secondary | ICD-10-CM | POA: Diagnosis not present

## 2024-02-09 DIAGNOSIS — W11XXXA Fall on and from ladder, initial encounter: Secondary | ICD-10-CM | POA: Diagnosis not present

## 2024-02-09 DIAGNOSIS — I10 Essential (primary) hypertension: Secondary | ICD-10-CM | POA: Insufficient documentation

## 2024-02-09 DIAGNOSIS — S2231XA Fracture of one rib, right side, initial encounter for closed fracture: Secondary | ICD-10-CM | POA: Diagnosis not present

## 2024-02-09 DIAGNOSIS — Z79899 Other long term (current) drug therapy: Secondary | ICD-10-CM | POA: Insufficient documentation

## 2024-02-09 DIAGNOSIS — S299XXA Unspecified injury of thorax, initial encounter: Secondary | ICD-10-CM | POA: Diagnosis present

## 2024-02-09 MED ORDER — KETOROLAC TROMETHAMINE 15 MG/ML IJ SOLN
15.0000 mg | Freq: Once | INTRAMUSCULAR | Status: AC
  Administered 2024-02-09: 15 mg via INTRAVENOUS
  Filled 2024-02-09: qty 1

## 2024-02-09 MED ORDER — MORPHINE SULFATE (PF) 4 MG/ML IV SOLN
4.0000 mg | Freq: Once | INTRAVENOUS | Status: AC
  Administered 2024-02-09: 4 mg via INTRAVENOUS
  Filled 2024-02-09: qty 1

## 2024-02-09 MED ORDER — ONDANSETRON HCL 4 MG/2ML IJ SOLN
4.0000 mg | Freq: Once | INTRAMUSCULAR | Status: AC
  Administered 2024-02-09: 4 mg via INTRAVENOUS
  Filled 2024-02-09: qty 2

## 2024-02-09 NOTE — ED Triage Notes (Signed)
 Pt arrives with c/o right sided rib cage and back pain after a fall last week. Pt reports falling off a ladder approximately 63ft.  Pt denies CP or SOB. Pt reports pain is worse with movement, deep breathing, and coughing.

## 2024-02-09 NOTE — Discharge Instructions (Signed)
 You broke your ninth rib which is what is causing your pain today.  You can take 2 Tylenol  and 2 ibuprofen together every 6 hours for pain.  However you cannot start the ibuprofen until tomorrow because of the medication you received here.  You can take Tylenol  tonight you could also try the over-the-counter lidocaine patches.  You will have to modify your activity so that you can tolerate the pain.  Return to the emergency room if you develop sudden shortness of breath.  It should heal on its own.

## 2024-02-09 NOTE — Telephone Encounter (Signed)
 FYI Only or Action Required?: FYI only for provider: ED advised.  Patient was last seen in primary care on 04/20/2023 by Amon Aloysius BRAVO, MD.  Called Nurse Triage reporting Fall.  Symptoms began a week ago.- worsened today  Interventions attempted: OTC medications: ibuprofen.  Symptoms are: rapidly worsening.  Triage Disposition: Go to ED Now (Notify PCP)  Patient/caregiver understands and will follow disposition?: Yes     Copied from CRM [id number]. Topic: Clinical - Red Word Triage >> Feb 09, 2024  2:37 PM Tonda B wrote: Red Word that prompted transfer to Nurse Triage: patient feel off a ladder last week patient is in  a lot of pain can barley walk Reason for Disposition  Injury (or injuries) that need emergency care  Answer Assessment - Initial Assessment Questions James [name] off a ladder last Tuesday about 6 foot off the ground and landed on his back. He has been having some pain but taking ibuprofen and has been tolerating it. Today he was having increased pain and felt like he needed to cough. Wife states that it took everything she had to get him in the house and in the recliner. RN advised ER with the need to cough and right side mid upper back pain increasing. She asked if an xray can just be done in the office and RN advised why ER was the best location. She stated understanding.    1. MECHANISM: How did the fall happen?     James [name] off a ladder 2. DOMESTIC VIOLENCE AND ELDER ABUSE SCREENING: Did you fall because someone pushed you or tried to hurt you? If Yes, ask: Are you safe now?      3. ONSET: When did the fall happen? (e.g., minutes, hours, or days ago)     Last tuesday 4. LOCATION: What part of the body hit the ground? (e.g., back, buttocks, head, hips, knees, hands, head, stomach)     back 5. INJURY: Did you hurt (injure) yourself when you fell? If Yes, ask: What did you injure? Tell me more about this? (e.g., body area; type of injury; pain severity)     Back  pain 6. PAIN: Is there any pain? If Yes, ask: How bad is the pain? (e.g., Scale 0-10; or none, mild,      Moderate today 7. SIZE: For cuts, bruises, or swelling, ask: How large is it? (e.g., inches or centimeters)       8. OTHER SYMPTOMS: Do you have any other symptoms? (e.g., dizziness, fever, weakness; new-onset or worsening).      Feels like he needs to cough, pain worse today.  10. CAUSE: What do you think caused the fall (or falling)? (e.g., dizzy spell, tripped)       James [name] off ladder  Protocols used: Falls and [facility]

## 2024-02-09 NOTE — Telephone Encounter (Signed)
FYI. Pt going to ED.  

## 2024-02-09 NOTE — ED Provider Notes (Signed)
 " [place] [facility] Provider Note   CSN: [id number] Arrival date & time: 02/09/24  1529     Patient presents with: James [name] is a 74 y.o. male.   Patient is a 75 year old male who is overall healthy presenting today with complaints of severe pain in his right posterior rib area.  Patient states approximately 1 week ago he fell off a ladder.  He is helping his son build a barn and he was walking up the ladder with his hands full so was not holding on and lost his balance falling backward approximately 6 feet onto the ground.  He reports that he landed kind of on his side and thinks even his jaw hit the grass.  He initially was having pain but was ultimately able to get up and reports every day since then he still been able to work and he has been sore but it has been gradually improving every day with Advil.  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  He reports that it is severe anytime he tries to move also with coughing.  He has not been specifically short of breath.  He denies any pain anywhere else.  He does not take any anticoagulation.  He has had no problems with his mobility.  He tried taking Advil at home but it did not help this time.  The history is provided by the patient and the spouse.  Fall       Prior to Admission medications  Medication Sig Start Date End Date Taking? Authorizing Provider  alfuzosin  (UROXATRAL ) 10 MG 24 hr tablet Take 1 tablet (10 mg total) by mouth daily. 06/15/23   Stoneking, Adine PARAS., MD  atorvastatin  (LIPITOR) 20 MG tablet TAKE 1 TABLET AT BEDTIME 09/04/23   Webb, Padonda B, FNP  Misc Natural Products (OSTEO BI-FLEX/5-LOXIN ADVANCED PO)  04/23/12   [provider]  Omega-3 Fatty Acids (FISH OIL) 1000 MG CAPS Take by mouth.    [provider]     Allergies: Nickel    Review of Systems  Updated Vital Signs BP (!) 151/97   Pulse 64   Temp 98 F (36.7 C) (Oral)   Resp 19   Wt 93 kg   SpO2 99%   BMI 26.32 kg/m   Physical Exam Vitals and nursing note reviewed.  Constitutional:      General: He is not in acute distress.    Appearance: He is well-developed.  HENT:     Head: Normocephalic and atraumatic.  Eyes:     Conjunctiva/sclera: Conjunctivae normal.     Pupils: Pupils are equal, round, and reactive to light.  Cardiovascular:     Rate and Rhythm: Normal rate and regular rhythm.     Pulses: Normal pulses.     Heart sounds: No murmur heard. Pulmonary:     Effort: Pulmonary effort is normal. No respiratory distress.     Breath sounds: Normal breath sounds. No wheezing or rales.       Comments: Significant point tenderness in the area indicated.  No flank tenderness, no anterior chest wall tenderness.  No midline thoracic spine tenderness Abdominal:     General: There is no distension.     Palpations: Abdomen is soft.     Tenderness: There is no abdominal tenderness.  There is no guarding or rebound.  Musculoskeletal:        General: No tenderness. Normal range of motion.     Cervical back: Normal range of motion and neck supple.  Skin:    General: Skin is warm and dry.     Findings: No erythema or rash.  Neurological:     Mental Status: He is alert and oriented to person, place, and time. Mental status is at baseline.     Sensory: No sensory deficit.     Motor: No weakness.  Psychiatric:        Behavior: Behavior normal.     (all labs ordered are listed, but only abnormal results are displayed) Labs Reviewed - No data to display  EKG: None  Radiology: CT Chest Wo Contrast Result Date: 02/09/2024 EXAM: CT CHEST WITHOUT CONTRAST 02/09/2024 05:02:00 PM TECHNIQUE: CT of the chest was performed without the administration of intravenous contrast. Multiplanar reformatted images are provided for review.  Automated exposure control, iterative reconstruction, and/or weight based adjustment of the mA/kV was utilized to reduce the radiation dose to as low as reasonably achievable. COMPARISON: None available. CLINICAL HISTORY: Chest trauma, blunt; posterior right rib pain. Right lower rib pain after a fall 1 week ago. FINDINGS: MEDIASTINUM: Normal heart size. Normal caliber thoracic aorta. Mild aortic and coronary artery calcifications. Pericardium is unremarkable. Airways are patent. The thyroid  gland is unremarkable. The esophagus is decompressed. LYMPH NODES: No significant mediastinal lymphadenopathy. No hilar or axillary lymphadenopathy. LUNGS AND PLEURA: Right apical pulmonary nodule measuring 4 mm in diameter (series 302 image 27). Mild dependent atelectasis in the lung bases. No focal consolidation or pulmonary edema. No pleural effusion or pneumothorax. Please provide follow-up recommendations. SOFT TISSUES/BONES: Nondisplaced acute fracture of the posterior right 9th rib. 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 Scattered degenerative changes throughout the thoracic spine with disc space narrowing and endplate osteophyte formation. No acute abnormality of the soft tissues. UPPER ABDOMEN: Limited images of the upper abdomen demonstrate focal atrophy or scarring of the upper pole of the left kidney. No acute abnormalities demonstrated in the upper abdomen. IMPRESSION: 1. Nondisplaced acute fracture of the posterior right 9th rib. 2. Right apical 4 mm pulmonary nodule, with optional non-contrast chest CT at 12 months if the patient is high risk, as per Fleischner Society Guidelines. Electronically signed by: Elsie Gravely MD 02/09/2024 05:17 PM EST RP Workstation: [id number]     Procedures   Medications Ordered in the ED  morphine  (PF) 4 MG/ML injection 4 mg (4 mg Intravenous Given 02/09/24 1619)  ondansetron  (ZOFRAN ) injection 4 mg (4 mg Intravenous Given 02/09/24  1619)  ketorolac  (TORADOL ) 15 MG/ML injection 15 mg (15 mg Intravenous Given 02/09/24 1811)                                    Medical Decision Making Amount and/or Complexity of Data Reviewed Radiology: ordered and independent interpretation performed. Decision-making details documented in ED Course.  Risk Prescription drug management.   Pt presenting today with a complaint that caries a high risk for morbidity and mortality. Here today with severe pain in the right posterior rib area.  Concern for muscle spasm, rib fracture, lower suspicion for pneumothorax.  Patient has no midline tenderness suggestive of thoracic fracture.  No lower abdominal or flank tenderness concerning for acute intra-abdominal process.  Also since the initial fall occurred 1  week ago suspect if there was any type of solid organ injury in the abdomen he would have had pain by now.  He does report that there has been pain in this area the entire time since the fall but it only became severe today.  Patient is hypertensive here however suspect that might be related to pain.  Otherwise O2 sats are 98%.  Will do a CT for further evaluation.  Patient also given pain control.  I have independently visualized and interpreted pt's images today.  CT today with evidence of a rib fracture.  No evidence of pneumothorax.  Radiology reports nondisplaced acute fracture of the posterior right ninth rib as well as a pulmonary nodule but discussing with patient he is never been a smoker and do not feel that he is high risk.  Did tell him about the nodule and he can follow-up with his doctor.      Final diagnoses:  Closed fracture of one rib of right side, initial encounter  Pulmonary nodule    ED Discharge Orders     None          Doretha Folks, MD 02/10/24 0002  "

## 2024-02-09 NOTE — ED Notes (Signed)
 Patient transported to CT

## 2024-03-09 ENCOUNTER — Other Ambulatory Visit: Payer: Self-pay | Admitting: Family

## 2024-03-14 ENCOUNTER — Encounter: Admitting: Internal Medicine

## 2024-03-14 ENCOUNTER — Ambulatory Visit

## 2024-07-06 ENCOUNTER — Ambulatory Visit: Admitting: Urology
